# Patient Record
Sex: Female | Born: 1960 | Race: White | Hispanic: No | Marital: Married
Health system: Midwestern US, Academic
[De-identification: ages and names within clinical notes are randomized; demographics above are authoritative.]

## PROBLEM LIST (undated history)

## (undated) MED FILL — METHOCARBAMOL 500 MG TABLET: 500 500 MG | ORAL | 30 days supply | Qty: 90 | Fill #0

## (undated) MED FILL — PREDNISONE 10 MG TABLET: 10 10 MG | ORAL | 30 days supply | Qty: 30 | Fill #0

## (undated) MED FILL — IXEKIZUMAB 80 MG/ML SUBCUTANEOUS AUTO-INJECTOR: 80 80 mg/mL | SUBCUTANEOUS | 28 days supply | Qty: 3 | Fill #0

## (undated) MED FILL — SECUKINUMAB 150 MG/ML SUBCUTANEOUS PEN INJECTOR: 150 150 mg/mL | SUBCUTANEOUS | 28 days supply | Qty: 8 | Fill #0

---

## 2014-12-21 NOTE — Unmapped (Signed)
Patient called to get an appointment with Linda Matthews.  Per patient she is having discharge and redness from her left breast.  Dr. Desma Paganini was giving the patient hormone pellets which she believes could be the cause of her issue.  Please call to get the patient an appointment.

## 2017-06-16 NOTE — Telephone Encounter (Signed)
Abstract  

## 2017-08-28 ENCOUNTER — Ambulatory Visit: Admit: 2017-08-28 | Discharge: 2017-08-28 | Payer: PRIVATE HEALTH INSURANCE

## 2017-08-28 ENCOUNTER — Other Ambulatory Visit: Admit: 2017-08-28 | Payer: PRIVATE HEALTH INSURANCE

## 2017-08-28 ENCOUNTER — Inpatient Hospital Stay: Admit: 2017-08-28 | Discharge: 2017-09-01 | Payer: PRIVATE HEALTH INSURANCE

## 2017-08-28 DIAGNOSIS — M2550 Pain in unspecified joint: Secondary | ICD-10-CM

## 2017-08-28 DIAGNOSIS — M19042 Primary osteoarthritis, left hand: Secondary | ICD-10-CM

## 2017-08-28 LAB — VITAMIN D 25 HYDROXY: Vit D, 25-Hydroxy: 26.5 ng/mL (ref 30.0–100)

## 2017-08-28 LAB — HLA-B27 HISTOCOMPATIBILITY ANTIGEN: HLA B27: NEGATIVE

## 2017-08-28 LAB — ANA W/REFLEX TO IFA/COMP PANEL
ANA Ratio: 0.86 ratio (ref 0.00–0.99)
ANA Screen: NEGATIVE

## 2017-08-28 LAB — ANA (IFA) WITH TITER
ANA Titer by IFA: POSITIVE
Homogeneous Pattern: 1:320 {titer}
Nucleolar Pattern: 1:160 {titer}

## 2017-08-28 LAB — DOUBLE STRANDED DNA AB
ds DNA Ab: NEGATIVE
dsDNA NUM: 175 [IU]/mL (ref 0–200)

## 2017-08-28 LAB — CK: Total CK: 55 U/L (ref 30–223)

## 2017-08-28 LAB — C3 COMPLEMENT: C3 Complement: 135 mg/dL (ref 87–200)

## 2017-08-28 LAB — URIC ACID: Uric Acid: 5.8 mg/dL (ref 3.8–8.7)

## 2017-08-28 LAB — C4 COMPLEMENT: C4 Complement: 40 mg/dL (ref 19–52)

## 2017-08-28 NOTE — Progress Notes (Signed)
Subjective   HPI:   Patient ID: Linda Matthews is a 57 y.o. White or Caucasian female.    Chief Complaint:  Linda Matthews is a 57 y.o. White or Caucasian female here for a new patient visit.    She has been referred to rheumatology by Cristela Felt DO for concerns of joint pain.    She reports having seen by Dr. Sebastian Ache at [place], [place], and Dr. Manson Passey at [facility] in the past.    She reports her symptoms started in 2018 around June and have since progressed esp involving hands, thumb bases, right knee right ankle as well. She was diagnosed with psoriatic arthritis based on oligoarticular joint involvement and presence of sausage digit history. She has also tried physical therapy and steroid injection for the right trochanteric bursa.     Today she reports morning stiffness lasting about 20 minutes, and pain and stiffness associated with the bilateral ankles, hands, knees and lower back.     In terms of treatment, she has tried 12 weeks of enbrel which helped some, prednisone tapers which helped a lot, a month of remicade infusions causing increased heart rate, insufficient relief, fatigue, weakness and other issues. NSAIDs have helped her some as well and she has been on it for at least 11 months now. She reports her pain at a 4/10 and discomfort at a 7.10 in severity.    She reports she noticed initially her ankle on the right foot swelling up on her. She reports having minimal stiffness and the pain only came on when she walked on it. She then noticed pain and swelling for about a month. She noticed that it went away on its own. She took [place] which caused diarrhea and other issues. She took the enbrel which gave her a lot of fatigue symptoms and it did not work well. She reports she had switch to remicade and after two doses, she was noticed to have more shortness of breath and other symptoms.    She reports a new low back stiffness symptoms. This lasts about 45 minutes and is mostly in the  lower lumbar region.       No chief complaint on file.         Histories:     Past Medical History:   Diagnosis Date    Abnormal Pap smear of cervix     Acne     Allergy     Arthritis     Bilateral ovarian cysts     Breast disorder     Esophageal reflux     Fibroid, uterine     History of chicken pox     Hypertension     Menopausal symptoms     OSA on CPAP     Palpitations     Rh incompatibility      Social History     Social History    Marital status: Married     Spouse name: N/A    Number of children: N/A    Years of education: N/A     Social History Main Topics    Smoking status: Never Smoker    Smokeless tobacco: Never Used    Alcohol use Yes      Comment: SOCIALLY    Drug use: Unknown    Sexual activity: Not Asked     Other Topics Concern    None     Social History Narrative    None     Family History  Problem Relation Age of Onset    Alzheimer's disease Mother     Breast Cancer Mother     Dementia Mother     Depression Mother     Heart disease Mother     Hypertension Mother     Hyperlipidemia Mother     Snoring Mother     Arthritis Father     Cancer Father     Heart disease Father     Hypertension Father     Hyperlipidemia Father     Kidney disease Father     Snoring Father     Alcohol abuse Sister     Cancer Sister     Depression Sister     Hypertension Sister     Mental illness Sister     Sleep apnea Sister     Snoring Sister     Dementia Maternal Grandmother     Stroke Maternal Grandmother     Cancer Maternal Grandfather     Alzheimer's disease Maternal Aunt      OB History     No data available             ROS:   Review of Systems:   All other systems negative    Constitutional:      fatigue     weakness  Ears/Nose/Mouth/Throat:      tinnitus  Cardiovascular:      high blood pressure  Musculoskeletal:      morning stiffness     arthralgias     muscle weakness     joint swelling      I have reviewed the full 14 point review of systems and agree as stated,  with changes noted where appropriate.    Objective:   Physical Exam   Nursing note and vitals reviewed.  Constitutional: She is oriented to person, place, and time. She appears well-developed and well-nourished.   HENT:   Head: Normocephalic and atraumatic.   Eyes: Right eye exhibits no discharge. Left eye exhibits no discharge. No scleral icterus.   Neck: Normal range of motion. No thyromegaly present.   Cardiovascular: Normal rate and regular rhythm.  Exam reveals no gallop and no friction rub.    No murmur heard.  Pulmonary/Chest: Effort normal. No respiratory distress. She has no wheezes. She has no rales.   Abdominal: Soft. She exhibits no distension. There is no tenderness. There is no rebound.   Musculoskeletal: She exhibits tenderness. She exhibits no edema or deformity.   Neurological: She is oriented to person, place, and time.   Skin: Skin is warm. Rash noted. No erythema.   Psychiatric: She has a normal mood and affect. Her behavior is normal. Thought content normal.       Medications:     Current Outpatient Prescriptions:     benzoyl peroxide-erythromycin (BENZAMYCIN) gel, APPLY TO THE AFFECTED AREA(S) daily AS NEEDED, Disp: , Rfl:     celecoxib (CELEBREX) 200 MG capsule, Take by mouth., Disp: , Rfl:     citalopram (CELEXA) 20 MG tablet, Take 1 tablet by mouth daily., Disp: , Rfl:     losartan-hydrochlorothiazide (HYZAAR) 100-25 mg per tablet, , Disp: , Rfl:     amLODIPine (NORVASC) 10 MG tablet, Take by mouth., Disp: , Rfl:     cephALEXin (KEFLEX) 500 MG capsule, TAKE 1 CAPSULE BY MOUTH THREE TIMES DAILY for TEN days, Disp: , Rfl: 0    citalopram (CELEXA) 10 MG tablet, Take by mouth., Disp: , Rfl:  escitalopram oxalate (LEXAPRO) 20 MG tablet, Take by mouth., Disp: , Rfl:     etanercept (ENBREL) 50 mg/mL (0.98 mL) injection, Inject subcutaneously., Disp: , Rfl:     famotidine (PEPCID) 20 MG tablet, Take by mouth., Disp: , Rfl:     hydroxychloroquine (PLAQUENIL) 200 mg tablet, Take by  mouth., Disp: , Rfl:     ibuprofen (ADVIL) 100 MG tablet, Take by mouth., Disp: , Rfl:     ibuprofen (ADVIL,MOTRIN) 200 MG tablet, Take by mouth., Disp: , Rfl:     methylPREDNISolone (MEDROL DOSEPACK) 4 mg tablet, follow taper instructions in package, Disp: , Rfl: 2    venlafaxine (EFFEXOR-XR) 75 MG 24 hr capsule, Take by mouth., Disp: , Rfl:     Allergies:     Allergies   Allergen Reactions    Sulfur      Hives    Sulfa (Sulfonamide Antibiotics) Hives and Rash       Lab Review:   Lab Results   Component Value Date    DSDNA Negative 08/28/2017    C3 135 08/28/2017    C4 40 08/28/2017    HLAB27 Negative 08/28/2017     No visits with results within 2 Month(s) from this visit.   Latest known visit with results is:   No results found for Linda previous visit.         Prior Diagnostic Testing:  Imaging reviewed   No results found.    Rapid3 - Routine Assessment of Patient Index Data  My Chart Rapid3 Questionnaire Review 08/28/2017   Dress yourself, including tying shoelaces and doing buttons? 0   Get in and out of bed? 0   Lift a full cup or glass to your mouth? 0   Walk outdoors on flat ground? 0   Wash and dry your entire body? 0   Bend down to pick up clothing from the floor? 0   Turn regular faucets on and off? 0   Get in and out of a car, bus, train, or airplane? 0   Walk 2 miles or three kilometers, if you wish? 3   Paticipate in recreational activities and sports as you would like, if you wish? 3   Pain because of your condition over the past week 4.0   Considering how ways illness and health conditions affect you, how are you doing? 7.0   Rapid3 FN Total 2   Rapid3 Cumulative Score (0-30) 13       Conversion Table  Near Remission(NR):  1=0.3;  2=0.7;  3=1.0  Low Severity(LS):  4=1.3;  5=1.7;  6=2.0  Moderate Severity(MS):  7=2.3;  8=2.7;  9=3.0; 10=3.3;  11=3.7; 12=4.0  High Severity(HS):  13=4.3;  14=4.7;  15=5.0;  16=5.3;  17=5.7; 18=6.0;  19=6.3; 20=6.7; 21=7.0; 22=7.3; 23=7.7; 24=8.0; 25=8.3; 26=8.7;  27=9.0; 28=9.3; 29=9.7; 30=10.0    Patient's past medical history, social history, family history, allergies, and current medications were reviewed and updated as necessary.  Patient's lab work and imaging results were personally reviewed and discussed with the patient during today's visit.  Patient's concerns and questions about his diagnoses and medications were addressed with the patient and discussed in detail.       Assessment/Plan:   Linda Matthews is a 57 y.o. female.    Sicca symptoms  Previous ds DNA positive without SLE features  ? Sausage digit history ? Psoriatic arthritis    [place] notes to be obtained  Unclear findings of current inflammatory concerns needs more work up  EMG and MRI records    Orders Placed This Encounter   Procedures    X-ray Knee Left 3-views    X-ray Knee Right 3-views    X-ray Ankle Left min 3-views    X-ray Ankle Right min 3-views    X-ray Hand Left min 3-views    X-ray Hand Right min 3-views    X-ray SI joints minimum 3-views    ANA (IFA) with Titer    ANA w/reflex to IFA/Comp Panel    Double Stranded DNA Ab    C3 complement    C4 complement    Vitamin D 25 hydroxy    CK    HLA-B27 Histocompability Antigen    Uric acid     Will wait for the above results to discuss next steps with the patient.          The above assessment and plan, future steps and their importance, were discussed with the patient, who verbalized understanding of the same.  Patient's questions and/or concerns were addressed during the visit today.    Time Spent With Patient:  Time spent with patient:  60 minutes  Time spent discussing diagnosis, management, and treatment plan: > 35 minutes

## 2017-09-01 NOTE — Telephone Encounter (Signed)
Left message for pt to call

## 2017-09-01 NOTE — Unmapped (Signed)
-----   Message from Jiles Garter, MD sent at 09/01/2017 11:41 AM EDT -----  Degenerative osteoarthritis noted on imaging.  Marijean Bravo

## 2017-09-02 NOTE — Telephone Encounter (Signed)
Patient called back. She stated that she wanted to clarify if Dr. Cheri Rous was stating that there is not autoimmune disease present. She was wondering about the positive ANA . Please advise. OM

## 2017-09-02 NOTE — Telephone Encounter (Signed)
ANA positivity can be seen in multiple settings and may or may not reflect an autoimmune issue.   Still pending the HLA testing and will see how she does over the coming month and reassess for any new developments that might add to the current picture. Patients with current ANA positivity could in the future sometimes also develop a new autoimmune issues. Will discuss in greater detail at her coming visit later this month.    Linda Matthews

## 2017-09-02 NOTE — Telephone Encounter (Signed)
Pt has been notified of results    She looked at her labs, ANA is positive. She is asking for explanation of this result

## 2017-09-03 NOTE — Telephone Encounter (Signed)
Patient has been notified

## 2017-09-23 ENCOUNTER — Ambulatory Visit: Admit: 2017-09-23 | Discharge: 2017-09-23 | Payer: PRIVATE HEALTH INSURANCE

## 2017-09-23 ENCOUNTER — Other Ambulatory Visit: Admit: 2017-09-23 | Payer: PRIVATE HEALTH INSURANCE

## 2017-09-23 ENCOUNTER — Inpatient Hospital Stay: Admit: 2017-09-23 | Payer: PRIVATE HEALTH INSURANCE

## 2017-09-23 DIAGNOSIS — M064 Inflammatory polyarthropathy: Secondary | ICD-10-CM

## 2017-09-23 DIAGNOSIS — M1990 Unspecified osteoarthritis, unspecified site: Secondary | ICD-10-CM

## 2017-09-23 LAB — HEPATITIS B SURFACE ANTIGEN: Hep B Surface Ag: NONREACTIVE

## 2017-09-23 LAB — RHEUMATOID FACTOR: Rheumatoid Factor: <10 IU/mL (ref 0.0–14.0)

## 2017-09-23 LAB — C-REACTIVE PROTEIN: CRP: 10.4 mg/L (ref 1.0–10.0)

## 2017-09-23 LAB — SED RATE: Sed Rate: 10 mm/hr (ref 0–30)

## 2017-09-23 LAB — CYCLIC CITRUL POLYPEP AB IGG/IGA: Cyclic Citrullin Polypeptide Ab IgG/IgA: 5 units (ref 0–19)

## 2017-09-23 LAB — HEPATITIS B CORE IGM: Hep B Core IgM: NONREACTIVE

## 2017-09-23 LAB — HEPATITIS C ANTIBODY
HCV Ab: NONREACTIVE
HCVAB Number: 0.07 S/CO (ref 0.00–0.79)

## 2017-09-23 LAB — HEPATITIS A IGM: Hep A IgM: NONREACTIVE

## 2017-09-23 MED ORDER — predniSONE (DELTASONE) 10 MG tablet
10 | ORAL_TABLET | Freq: Every day | ORAL | 1 refills | Status: AC
Start: 2017-09-23 — End: 2017-10-09

## 2017-09-23 MED ORDER — ergocalciferol (ERGOCALCIFEROL) 50,000 unit capsule
1250 | ORAL_CAPSULE | ORAL | 2 refills | Status: AC
Start: 2017-09-23 — End: 2018-01-29

## 2017-09-23 NOTE — Unmapped (Signed)
Subjective   HPI:   Chief Complaint:  Linda Matthews is a 57 y.o. White or Caucasian female here for a Rheumatology follow up visit.    She is here for a follow up visit for sicca symptoms, psoriatic arthritis.    Last seen: 08/28/2017    Pain: (P) 6.0/10  Overall Discomfort: (P) 6.0/10  Flares: none  AM stiffness: 45 minutes   Medications used: as needed advil  Tolerating medications well without issues   Improved with: rest   Worse with:prolonged activity    She reports worsening Fatigue and Swelling of Ankles or Feet    Pending Dr. Aura Dials notes and EMG    Has tried enbrel and remicade in the past - she initially thought it helped but coming off of it has been causing issues    She has never started the plaquenil    Adverse effects of the remicade      Chief Complaint   Patient presents with   ??? Follow-up               Histories:     Past Medical History:   Diagnosis Date   ??? Abnormal Pap smear of cervix    ??? Acne    ??? Allergy    ??? Arthritis    ??? Bilateral ovarian cysts    ??? Breast disorder    ??? Esophageal reflux    ??? Fibroid, uterine    ??? History of chicken pox    ??? Hypertension    ??? Menopausal symptoms    ??? OSA on CPAP    ??? Palpitations    ??? Rh incompatibility       Social History     Social History   ??? Marital status: Married     Spouse name: N/A   ??? Number of children: N/A   ??? Years of education: N/A     Social History Main Topics   ??? Smoking status: Never Smoker   ??? Smokeless tobacco: Never Used   ??? Alcohol use Yes      Comment: SOCIALLY   ??? Drug use: Unknown   ??? Sexual activity: Not Asked     Other Topics Concern   ??? None     Social History Narrative   ??? None     Family History   Problem Relation Age of Onset   ??? Alzheimer's disease Mother    ??? Breast Cancer Mother    ??? Dementia Mother    ??? Depression Mother    ??? Heart disease Mother    ??? Hypertension Mother    ??? Hyperlipidemia Mother    ??? Snoring Mother    ??? Arthritis Father    ??? Cancer Father    ??? Heart disease Father    ??? Hypertension Father    ???  Hyperlipidemia Father    ??? Kidney disease Father    ??? Snoring Father    ??? Alcohol abuse Sister    ??? Cancer Sister    ??? Depression Sister    ??? Hypertension Sister    ??? Mental illness Sister    ??? Sleep apnea Sister    ??? Snoring Sister    ??? Dementia Maternal Grandmother    ??? Stroke Maternal Grandmother    ??? Cancer Maternal Grandfather    ??? Alzheimer's disease Maternal Aunt      OB History     No data available  ROS:   Review of Systems             I have reviewed the full 14 point review of systems and agree as stated, with changes noted where appropriate.    Objective:   Physical Exam   Nursing note and vitals reviewed.  Constitutional: She is oriented to person, place, and time. She appears well-developed and well-nourished.   HENT:   Head: Normocephalic and atraumatic.   Eyes: Right eye exhibits no discharge. Left eye exhibits no discharge. No scleral icterus.   Cardiovascular: Normal rate and regular rhythm.    Musculoskeletal: She exhibits tenderness. She exhibits no edema or deformity.   Neurological: She is alert and oriented to person, place, and time.   Skin: Skin is warm. Rash noted. No erythema.   Psychiatric: She has a normal mood and affect. Her behavior is normal.     Medications:     Current Outpatient Prescriptions:   ???  amLODIPine (NORVASC) 10 MG tablet, Take by mouth., Disp: , Rfl:   ???  benzoyl peroxide-erythromycin (BENZAMYCIN) gel, APPLY TO THE AFFECTED AREA(S) daily AS NEEDED, Disp: , Rfl:   ???  celecoxib (CELEBREX) 200 MG capsule, Take by mouth., Disp: , Rfl:   ???  cephALEXin (KEFLEX) 500 MG capsule, TAKE 1 CAPSULE BY MOUTH THREE TIMES DAILY for TEN days, Disp: , Rfl: 0  ???  citalopram (CELEXA) 10 MG tablet, Take by mouth., Disp: , Rfl:   ???  citalopram (CELEXA) 20 MG tablet, Take 1 tablet by mouth daily., Disp: , Rfl:   ???  escitalopram oxalate (LEXAPRO) 20 MG tablet, Take by mouth., Disp: , Rfl:   ???  etanercept (ENBREL) 50 mg/mL (0.98 mL) injection, Inject subcutaneously., Disp: , Rfl:   ???   famotidine (PEPCID) 20 MG tablet, Take by mouth., Disp: , Rfl:   ???  hydroxychloroquine (PLAQUENIL) 200 mg tablet, Take by mouth., Disp: , Rfl:   ???  ibuprofen (ADVIL) 100 MG tablet, Take by mouth., Disp: , Rfl:   ???  ibuprofen (ADVIL,MOTRIN) 200 MG tablet, Take by mouth., Disp: , Rfl:   ???  losartan-hydrochlorothiazide (HYZAAR) 100-25 mg per tablet, , Disp: , Rfl:   ???  methylPREDNISolone (MEDROL DOSEPACK) 4 mg tablet, follow taper instructions in package, Disp: , Rfl: 2  ???  venlafaxine (EFFEXOR-XR) 75 MG 24 hr capsule, Take by mouth., Disp: , Rfl:     Allergies:     Allergies   Allergen Reactions   ??? Sulfur      Hives   ??? Sulfa (Sulfonamide Antibiotics) Hives and Rash       Lab Review:   Lab Results   Component Value Date    DSDNA Negative 08/28/2017    C3 135 08/28/2017    C4 40 08/28/2017    HLAB27 Negative 08/28/2017     Specimen on 08/28/2017   Component Date Value   ??? ANA Titer by IFA 08/28/2017 Positive*   ??? Homogeneous Pattern 08/28/2017 1:320*   ??? Nucleolar Pattern 08/28/2017 1:160*   ??? Please Note 08/28/2017 Comment    ??? ANA Screen 08/28/2017 Negative    ??? ANA Ratio 08/28/2017 0.86    ??? ds DNA Ab 08/28/2017 Negative    ??? dsDNA NUM 08/28/2017 175    ??? C3 Complement 08/28/2017 135    ??? C4 Complement 08/28/2017 40    ??? Vit D, 25-Hydroxy 08/28/2017 26.5*   ??? Total CK 08/28/2017 55    ??? HLA B27 08/28/2017 Negative    ???  Uric Acid 08/28/2017 5.8            Prior Diagnostic Testing:  Imaging reviewed   X-ray Si Joints Minimum 3-views    Result Date: 08/28/2017  IMPRESSION: Sacroiliac joints: No erosions or significant arthropathic changes. 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 Right hand: Moderate to severe arthrosis of the first carpometacarpal joint. Nonspecific lucency in the lunate, which could represent represent a small reactive cyst or erosion. Left hand: Severe first carpometacarpal  joint arthrosis. Right ankle: No acute osseous abnormality or significant arthropathic changes. Left ankle: No acute osseous abnormality or significant arthropathic changes. Report Verified by: Particia Nearing, MD at 08/28/2017 4:08 PM EDT    X-ray Hand Left Min 3-views    Result Date: 08/28/2017  IMPRESSION: Sacroiliac joints: No erosions or significant arthropathic changes. 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 Right hand: Moderate to severe arthrosis of the first carpometacarpal joint. Nonspecific lucency in the lunate, which could represent represent a small reactive cyst or erosion. Left hand: Severe first carpometacarpal joint arthrosis. Right ankle: No acute osseous abnormality or significant arthropathic changes. Left ankle: No acute osseous abnormality or significant arthropathic changes. Report Verified by: Particia Nearing, MD at 08/28/2017 4:08 PM EDT    X-ray Hand Right Min 3-views    Result Date: 08/28/2017  IMPRESSION: Sacroiliac joints: No erosions or significant arthropathic changes. 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 Right hand: Moderate to severe arthrosis of the first carpometacarpal joint. Nonspecific lucency in the lunate, which could represent represent a small reactive cyst or erosion. Left hand: Severe first carpometacarpal joint arthrosis. Right ankle: No acute osseous abnormality or significant arthropathic changes. Left ankle: No acute osseous abnormality or significant arthropathic changes. Report Verified by: Particia Nearing, MD at 08/28/2017 4:08 PM EDT    X-ray Knee Left 3-views    Result Date: 08/28/2017  IMPRESSION: Sacroiliac joints: No erosions or significant arthropathic changes. Right knee: Mild anterior compartment predominant  tricompartmental osteoarthrosis. 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 Right hand: Moderate to severe arthrosis of the first carpometacarpal joint. Nonspecific lucency in the lunate, which could represent represent a small reactive cyst or erosion. Left hand: Severe first carpometacarpal joint arthrosis. Right ankle: No acute osseous abnormality or significant arthropathic changes. Left ankle: No acute osseous abnormality or significant arthropathic changes. Report Verified by: Particia Nearing, MD at 08/28/2017 4:08 PM EDT    X-ray Knee Right 3-views    Result Date: 08/28/2017  IMPRESSION: Sacroiliac joints: No erosions or significant arthropathic changes. 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 Right hand: Moderate to severe arthrosis of the first carpometacarpal joint. Nonspecific lucency in the lunate, which could represent represent a small reactive cyst or erosion. Left hand: Severe first carpometacarpal joint arthrosis. Right ankle: No acute osseous abnormality or significant arthropathic changes. Left ankle: No acute osseous abnormality or significant arthropathic changes. Report Verified by: Particia Nearing, MD at 08/28/2017 4:08 PM EDT    X-ray Ankle Left Min 3-views    Result Date: 08/28/2017  IMPRESSION: Sacroiliac joints: No erosions or significant arthropathic changes. 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 Right hand: Moderate to severe arthrosis of the first carpometacarpal joint. Nonspecific lucency in the lunate, which could represent represent  a small reactive cyst or erosion. Left hand: Severe first carpometacarpal joint arthrosis. Right ankle: No acute osseous abnormality or significant arthropathic changes.  Left ankle: No acute osseous abnormality or significant arthropathic changes. Report Verified by: Particia Nearing, MD at 08/28/2017 4:08 PM EDT    X-ray Ankle Right Min 3-views    Result Date: 08/28/2017  IMPRESSION: Sacroiliac joints: No erosions or significant arthropathic changes. 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 Right hand: Moderate to severe arthrosis of the first carpometacarpal joint. Nonspecific lucency in the lunate, which could represent represent a small reactive cyst or erosion. Left hand: Severe first carpometacarpal joint arthrosis. Right ankle: No acute osseous abnormality or significant arthropathic changes. Left ankle: No acute osseous abnormality or significant arthropathic changes. Report Verified by: Particia Nearing, MD at 08/28/2017 4:08 PM EDT      Rapid3 - Routine Assessment of Patient Index Data  My Chart Rapid3 Questionnaire Review 09/22/2017 08/28/2017   Dress yourself, including tying shoelaces and doing buttons?  Without Any Difficulty -   Get in and out of bed?  With Some Difficulty -   Lift a full cup or glass to your mouth? Without Any Difficulty -   Walk outdoors on flat ground? Without Any Difficulty -   Wash and dry your entire body? Without Any Difficulty -   Bend down to pick up clothing from the floor? Without Any Difficulty -   Get in and out of a car, bus, train, or airplane? Without Any Difficulty -   Walk 2 miles or three kilometers, if you wish? Unable To Do -   Paticipate in recreational activities and sports as you would like, if you wish? Unable To Do -   Get a good night???s sleep? With Some Difficulty -   Deal with feelings of anxiety or being nervous? Without Any Difficulty -   Deal with feelings of depression or feeling blue? Without Any Difficulty -   Pain because of your condition over the past week 6.0 -   Considering how ways illness and  health conditions affect you, how are you doing? 6.0 -   Rapid Score A-J FN (0-10) 7 (2.3) -   Rapid3 PN Score (1-10) 6 -   Rapid3 PTGE Score (0-10) 6 -   Rapid3 Cumulative Score (0-30) 14.33 (HS) -   Dress yourself, including tying shoelaces and doing buttons? 0 0   Get in and out of bed? 1 0   Lift a full cup or glass to your mouth? 0 0   Walk outdoors on flat ground? 0 0   Wash and dry your entire body? 0 0   Bend down to pick up clothing from the floor? 0 0   Turn regular faucets on and off? 0 0   Get in and out of a car, bus, train, or airplane? 0 0   Walk 2 miles or three kilometers, if you wish? 3 3   Paticipate in recreational activities and sports as you would like, if you wish? 3 3   Pain because of your condition over the past week 6.0 4.0   Considering how ways illness and health conditions affect you, how are you doing? 6.0 7.0   Rapid3 FN Total 2.3 2   Rapid3 Cumulative Score (0-30) 14.3 13     Conversion Table  Near Remission(NR):  1=0.3;  2=0.7;  3=1.0  Low Severity(LS):  4=1.3;  5=1.7;  6=2.0  Moderate  Severity(MS):  7=2.3;  8=2.7;  9=3.0; 10=3.3;  11=3.7; 12=4.0  High Severity(HS):  13=4.3;  14=4.7;  15=5.0;  16=5.3;  17=5.7; 18=6.0;  19=6.3; 20=6.7; 21=7.0; 22=7.3; 23=7.7; 24=8.0; 25=8.3; 26=8.7; 27=9.0; 28=9.3; 29=9.7; 30=10.0    How easily do you do the following:             Patient's past medical history, social history, family history, allergies, and current medications were reviewed and updated as necessary.  Patient's lab work and imaging results were personally reviewed and discussed with the patient during today's visit.  Patient's concerns and questions about his diagnoses and medications were addressed with the patient and discussed in detail.       Assessment/Plan:   Terralyn is a 57 y.o. female.    Encounter Diagnosis   Name Primary?   ??? Inflammatory arthritis Yes     Orders Placed This Encounter   Procedures   ??? X-ray Chest PA and Lateral   ??? Sed Rate   ??? C-reactive protein   ???  Rheumatoid factor, Serum   ??? Acute Hepatitis Panel   ??? Cyclic Citrul Polypep Ab IgG/IgA     Reviewed notes from [facility] and ordered repeat blood work including RF CCP and also a baseline chest x ray and inflammatory markers and acute hepatitis panel.    She has had issues with remicade unclear if an infusion reaction but would not re challenge. There is thought of ? Psoriatic arthritis peripheral inflammatory arthritis that needs management. Might consider alternative TNF inhibitor for use. Provided patient information on this and will follow up after review of Dr. Aura Dials notes and blood work.          The above assessment and plan, future steps and their importance, were discussed with the patient, who verbalized understanding of the same.  Patient's questions and/or concerns were addressed during the visit today.

## 2017-10-07 NOTE — Telephone Encounter (Signed)
Pt contacted the office with update for MD. Pt stated she attempted to take 20 mg prednisone daily, ineffective. Pt then increased prednisone to 20 mg am & 10 mg pm, ineffective. Pt then increased prednisone to 20 mg am, 10 mg with lunch, & 10 mg pm. Per pt this dose of prednisone has been effective with swelling, but she is still experiencing joint pain that moves around, pt is unable to taper prednisone down. MD made aware of update.

## 2017-10-07 NOTE — Telephone Encounter (Signed)
Current dose of prednisone is too high to be on for inflammtory issues and we discused about worsening skin psoriasis since that is the diagnosis Dr. Aura Dials gave her. Will assess joint this week at her appointment but would recommend not exceeding 20 mg a day and avoid any NSAIDs while on it.    GG

## 2017-10-07 NOTE — Telephone Encounter (Signed)
Patient has been notified to go over 20 mg of prednisone

## 2017-10-09 ENCOUNTER — Ambulatory Visit: Admit: 2017-10-09 | Discharge: 2017-10-09 | Payer: PRIVATE HEALTH INSURANCE

## 2017-10-09 DIAGNOSIS — M47819 Spondylosis without myelopathy or radiculopathy, site unspecified: Secondary | ICD-10-CM

## 2017-10-09 MED ORDER — triamcinolone acetonide (KENALOG-40) injection 40 mg
40 | Freq: Once | INTRAMUSCULAR | Status: AC
Start: 2017-10-09 — End: 2017-10-09
  Administered 2017-10-09: 16:00:00 40 mg via INTRA_ARTICULAR

## 2017-10-09 MED ORDER — predniSONE (DELTASONE) 10 MG tablet
10 | ORAL_TABLET | Freq: Every day | ORAL | 1 refills | Status: AC
Start: 2017-10-09 — End: 2017-10-09

## 2017-10-09 MED ORDER — bupivacaine (MARCAINE) 0.5 % (5 mg/mL) injection 10 mg
0.5 | Freq: Once | INTRAMUSCULAR | Status: AC
Start: 2017-10-09 — End: 2017-10-09
  Administered 2017-10-09: 16:00:00 10 mL via OPHTHALMIC

## 2017-10-09 MED ORDER — predniSONE (DELTASONE) 10 MG tablet
10 | ORAL_TABLET | Freq: Every day | ORAL | 1 refills | Status: AC
Start: 2017-10-09 — End: 2017-11-01

## 2017-10-09 MED ORDER — secukinumab 150 mg/mL PnIj
150 | SUBCUTANEOUS | 0 refills | Status: AC
Start: 2017-10-09 — End: 2017-10-24

## 2017-10-09 NOTE — Telephone Encounter (Signed)
[facility] Note    Prescription received for: [email or web address]    Insurance rejection message: PA required    Insurance prior authorization process started via: CMM    Using EPIC chart as reference, provided requested information to insurance including:  diagnosis code, labs and treatment history    Claim status: Pending TB Test    Debarah Crape, PharmD, BCPS  10/09/2017 1:02 PM

## 2017-10-09 NOTE — Unmapped (Signed)
Subjective   HPI:   Chief Complaint:  Linda Matthews is a 57 y.o. White or Caucasian female here for a Rheumatology follow up visit.    She is here for a follow up visit for psoriatic arthritis.    Last seen: 09/23/2017    Pain: (P) 3.0/10  Overall Discomfort: (P) 3.0/10  Flares: none  AM stiffness: 45 minutes    Medications used: on hold for celebrex, plaquenil, prednisone 10 mg a day  Tolerating medications well without issues  Improved with: rest  Worse with: prolonged activity          Chief Complaint   Patient presents with   ??? Follow-up               Histories:     Past Medical History:   Diagnosis Date   ??? Abnormal Pap smear of cervix    ??? Acne    ??? Allergy    ??? Arthritis    ??? Bilateral ovarian cysts    ??? Breast disorder    ??? Esophageal reflux    ??? Fibroid, uterine    ??? History of chicken pox    ??? Hypertension    ??? Menopausal symptoms    ??? OSA on CPAP    ??? Palpitations    ??? Rh incompatibility       Social History     Social History   ??? Marital status: Married     Spouse name: N/A   ??? Number of children: N/A   ??? Years of education: N/A     Social History Main Topics   ??? Smoking status: Never Smoker   ??? Smokeless tobacco: Never Used   ??? Alcohol use Yes      Comment: SOCIALLY   ??? Drug use: Unknown   ??? Sexual activity: Not Asked     Other Topics Concern   ??? None     Social History Narrative   ??? None     Family History   Problem Relation Age of Onset   ??? Alzheimer's disease Mother    ??? Breast Cancer Mother    ??? Dementia Mother    ??? Depression Mother    ??? Heart disease Mother    ??? Hypertension Mother    ??? Hyperlipidemia Mother    ??? Snoring Mother    ??? Arthritis Father    ??? Cancer Father    ??? Heart disease Father    ??? Hypertension Father    ??? Hyperlipidemia Father    ??? Kidney disease Father    ??? Snoring Father    ??? Alcohol abuse Sister    ??? Cancer Sister    ??? Depression Sister    ??? Hypertension Sister    ??? Mental illness Sister    ??? Sleep apnea Sister    ??? Snoring Sister    ??? Dementia Maternal Grandmother    ??? Stroke  Maternal Grandmother    ??? Cancer Maternal Grandfather    ??? Alzheimer's disease Maternal Aunt      OB History     No data available             ROS:   Review of Systems             I have reviewed the full 14 point review of systems and agree as stated, with changes noted where appropriate.    Objective:   Physical Exam   Nursing note and vitals reviewed.  Constitutional: She  is oriented to person, place, and time. She appears well-developed and well-nourished.   HENT:   Head: Normocephalic and atraumatic.   Eyes: Right eye exhibits no discharge. Left eye exhibits no discharge. No scleral icterus.   Neck: Normal range of motion. No thyromegaly present.   Cardiovascular: Normal rate and regular rhythm.    Pulmonary/Chest: Effort normal. No respiratory distress.   Musculoskeletal: She exhibits edema and tenderness. She exhibits no deformity.   Neurological: She is alert and oriented to person, place, and time.   Skin: Skin is warm. No erythema.   Psychiatric: She has a normal mood and affect. Her behavior is normal.       Medications:     Current Outpatient Prescriptions:   ???  amLODIPine (NORVASC) 10 MG tablet, Take by mouth., Disp: , Rfl:   ???  benzoyl peroxide-erythromycin (BENZAMYCIN) gel, APPLY TO THE AFFECTED AREA(S) daily AS NEEDED, Disp: , Rfl:   ???  celecoxib (CELEBREX) 200 MG capsule, Take by mouth., Disp: , Rfl:   ???  cephALEXin (KEFLEX) 500 MG capsule, TAKE 1 CAPSULE BY MOUTH THREE TIMES DAILY for TEN days, Disp: , Rfl: 0  ???  citalopram (CELEXA) 10 MG tablet, Take by mouth., Disp: , Rfl:   ???  citalopram (CELEXA) 20 MG tablet, Take 1 tablet by mouth daily., Disp: , Rfl:   ???  ergocalciferol (ERGOCALCIFEROL) 50,000 unit capsule, Take 1 capsule (50,000 Units total) by mouth once a week., Disp: 4 capsule, Rfl: 2  ???  escitalopram oxalate (LEXAPRO) 20 MG tablet, Take by mouth., Disp: , Rfl:   ???  etanercept (ENBREL) 50 mg/mL (0.98 mL) injection, Inject subcutaneously., Disp: , Rfl:   ???  famotidine (PEPCID) 20 MG  tablet, Take by mouth., Disp: , Rfl:   ???  hydroxychloroquine (PLAQUENIL) 200 mg tablet, Take by mouth., Disp: , Rfl:   ???  ibuprofen (ADVIL) 100 MG tablet, Take by mouth., Disp: , Rfl:   ???  ibuprofen (ADVIL,MOTRIN) 200 MG tablet, Take by mouth., Disp: , Rfl:   ???  losartan-hydrochlorothiazide (HYZAAR) 100-25 mg per tablet, , Disp: , Rfl:   ???  predniSONE (DELTASONE) 10 MG tablet, Take 2 tablets (20 mg total) by mouth daily., Disp: 60 tablet, Rfl: 1  ???  venlafaxine (EFFEXOR-XR) 75 MG 24 hr capsule, Take by mouth., Disp: , Rfl:     Allergies:     Allergies   Allergen Reactions   ??? Sulfur      Hives   ??? Sulfa (Sulfonamide Antibiotics) Hives and Rash       Lab Review:   Lab Results   Component Value Date    ESR 10 09/23/2017    CRP 10.4 (H) 09/23/2017    RF <10.0 09/23/2017    DSDNA Negative 08/28/2017    C3 135 08/28/2017    C4 40 08/28/2017    HLAB27 Negative 08/28/2017     Specimen on 09/23/2017   Component Date Value   ??? Sed Rate 09/23/2017 10    ??? CRP 09/23/2017 10.4*   ??? Rheumatoid Factor 09/23/2017 <10.0    ??? Cyclic Citrullin Polypep* 09/23/2017 5    ??? HCV Ab 09/23/2017 Nonreactive    ??? HCVAB Number 09/23/2017 0.07    ??? Hep A IgM 09/23/2017 Nonreactive    ??? Hep B Core IgM 09/23/2017 Nonreactive    ??? Hepatitis B Surface Ag 09/23/2017 Nonreactive    Specimen on 08/28/2017   Component Date Value   ??? ANA Titer by IFA 08/28/2017 Positive*   ???  Homogeneous Pattern 08/28/2017 1:320*   ??? Nucleolar Pattern 08/28/2017 1:160*   ??? Please Note 08/28/2017 Comment    ??? ANA Screen 08/28/2017 Negative    ??? ANA Ratio 08/28/2017 0.86    ??? ds DNA Ab 08/28/2017 Negative    ??? dsDNA NUM 08/28/2017 175    ??? C3 Complement 08/28/2017 135    ??? C4 Complement 08/28/2017 40    ??? Vit D, 25-Hydroxy 08/28/2017 26.5*   ??? Total CK 08/28/2017 55    ??? HLA B27 08/28/2017 Negative    ??? Uric Acid 08/28/2017 5.8            Prior Diagnostic Testing:  Imaging reviewed   X-ray Chest Pa And Lateral    Result Date: 09/23/2017  IMPRESSION: No acute cardiopulmonary  findings. Approved by Theresa Mulligan, DO on 09/23/2017 9:12 AM EDT I have personally reviewed the images and I agree with this report. Report Verified by: Essie Hart, MD at 09/23/2017 9:35 AM EDT    X-ray Si Joints Minimum 3-views    Result Date: 08/28/2017  IMPRESSION: Sacroiliac joints: No erosions or significant arthropathic changes. 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 Right hand: Moderate to severe arthrosis of the first carpometacarpal joint. Nonspecific lucency in the lunate, which could represent represent a small reactive cyst or erosion. Left hand: Severe first carpometacarpal joint arthrosis. Right ankle: No acute osseous abnormality or significant arthropathic changes. Left ankle: No acute osseous abnormality or significant arthropathic changes. Report Verified by: Particia Nearing, MD at 08/28/2017 4:08 PM EDT    X-ray Hand Left Min 3-views    Result Date: 08/28/2017  IMPRESSION: Sacroiliac joints: No erosions or significant arthropathic changes. 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 Right hand: Moderate to severe arthrosis of the first carpometacarpal joint. Nonspecific lucency in the lunate, which could represent represent a small reactive cyst or erosion. Left hand: Severe first carpometacarpal joint arthrosis. Right ankle: No acute osseous abnormality or significant arthropathic changes. Left ankle: No acute osseous abnormality or significant arthropathic changes. Report Verified by: Particia Nearing, MD at 08/28/2017 4:08 PM EDT    X-ray Hand Right Min 3-views    Result Date: 08/28/2017  IMPRESSION: Sacroiliac joints: No erosions or significant arthropathic changes. 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 Left  knee: Mild anterior compartment predominant tricompartmental osteoarthrosis. Right hand: Moderate to severe arthrosis of the first carpometacarpal joint. Nonspecific lucency in the lunate, which could represent represent a small reactive cyst or erosion. Left hand: Severe first carpometacarpal joint arthrosis. Right ankle: No acute osseous abnormality or significant arthropathic changes. Left ankle: No acute osseous abnormality or significant arthropathic changes. Report Verified by: Particia Nearing, MD at 08/28/2017 4:08 PM EDT    X-ray Knee Left 3-views    Result Date: 08/28/2017  IMPRESSION: Sacroiliac joints: No erosions or significant arthropathic changes. 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 Right hand: Moderate to severe arthrosis of the first carpometacarpal joint. Nonspecific lucency in the lunate, which could represent represent a small reactive cyst or erosion. Left hand: Severe first carpometacarpal joint arthrosis. Right ankle: No acute osseous abnormality or significant arthropathic changes. Left ankle: No acute osseous abnormality or significant arthropathic changes. Report Verified by: Particia Nearing, MD at 08/28/2017 4:08 PM EDT    X-ray Knee Right 3-views    Result Date: 08/28/2017  IMPRESSION:  Sacroiliac joints: No erosions or significant arthropathic changes. 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 Right hand: Moderate to severe arthrosis of the first carpometacarpal joint. Nonspecific lucency in the lunate, which could represent represent a small reactive cyst or erosion. Left hand: Severe first carpometacarpal joint arthrosis. Right ankle: No acute osseous abnormality or significant arthropathic changes. Left ankle: No acute osseous abnormality or  significant arthropathic changes. Report Verified by: Particia Nearing, MD at 08/28/2017 4:08 PM EDT    X-ray Ankle Left Min 3-views    Result Date: 08/28/2017  IMPRESSION: Sacroiliac joints: No erosions or significant arthropathic changes. 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 Right hand: Moderate to severe arthrosis of the first carpometacarpal joint. Nonspecific lucency in the lunate, which could represent represent a small reactive cyst or erosion. Left hand: Severe first carpometacarpal joint arthrosis. Right ankle: No acute osseous abnormality or significant arthropathic changes. Left ankle: No acute osseous abnormality or significant arthropathic changes. Report Verified by: Particia Nearing, MD at 08/28/2017 4:08 PM EDT    X-ray Ankle Right Min 3-views    Result Date: 08/28/2017  IMPRESSION: Sacroiliac joints: No erosions or significant arthropathic changes. 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 Right hand: Moderate to severe arthrosis of the first carpometacarpal joint. Nonspecific lucency in the lunate, which could represent represent a small reactive cyst or erosion. Left hand: Severe first carpometacarpal joint arthrosis. Right ankle: No acute osseous abnormality or significant arthropathic changes. Left ankle: No acute osseous abnormality or significant arthropathic changes. Report Verified by: Particia Nearing, MD at 08/28/2017 4:08 PM EDT      Rapid3 - Routine Assessment of Patient Index Data  My Chart Rapid3 Questionnaire Review 10/06/2017 09/22/2017 08/28/2017   Dress yourself, including tying shoelaces and doing buttons?  Without Any Difficulty Without Any Difficulty -   Get in and out of bed?  Without Any Difficulty With Some Difficulty -   Lift a full cup  or glass to your mouth? Without Any Difficulty Without Any Difficulty -   Walk outdoors on flat ground? Without Any Difficulty Without Any Difficulty -   Wash and dry your entire body? Without Any Difficulty Without Any Difficulty -   Bend down to pick up clothing from the floor? Without Any Difficulty Without Any Difficulty -   Get in and out of a car, bus, train, or airplane? Without Any Difficulty Without Any Difficulty -   Walk 2 miles or three kilometers, if you wish? With Much Difficulty Unable To Do -   Paticipate in recreational activities and sports as you would like, if you wish? With Some Difficulty Unable To Do -   Get a good night???s sleep? With Some Difficulty With Some Difficulty -   Deal with feelings of anxiety or being nervous? Without Any Difficulty Without Any Difficulty -   Deal with feelings of depression or feeling blue? Without Any Difficulty Without Any Difficulty -   Pain because of your condition over the past week 3.0 6.0 -   Considering how ways illness and health conditions affect you, how are you doing? 3.0 6.0 -   Rapid Score A-J FN (0-10) 3 (1.0) 7 (2.3) -   Rapid3 PN Score (1-10) 3 6 -   Rapid3 PTGE Score (0-10) 3 6 -   Rapid3 Cumulative Score (0-30) 7 (MS) 14.33 (HS) -  Dress yourself, including tying shoelaces and doing buttons? 0 0 0   Get in and out of bed? 0 1 0   Lift a full cup or glass to your mouth? 0 0 0   Walk outdoors on flat ground? 0 0 0   Wash and dry your entire body? 0 0 0   Bend down to pick up clothing from the floor? 0 0 0   Turn regular faucets on and off? 0 0 0   Get in and out of a car, bus, train, or airplane? 0 0 0   Walk 2 miles or three kilometers, if you wish? 2 3 3    Paticipate in recreational activities and sports as you would like, if you wish? 1 3 3    Pain because of your condition over the past week 3.0 6.0 4.0   Considering how ways illness and health conditions affect you, how are you doing? 3.0 6.0 7.0   Rapid3 FN Total 1 2.3 2   Rapid3 Cumulative  Score (0-30) 7 14.3 13     Conversion Table  Near Remission(NR):  1=0.3;  2=0.7;  3=1.0  Low Severity(LS):  4=1.3;  5=1.7;  6=2.0  Moderate Severity(MS):  7=2.3;  8=2.7;  9=3.0; 10=3.3;  11=3.7; 12=4.0  High Severity(HS):  13=4.3;  14=4.7;  15=5.0;  16=5.3;  17=5.7; 18=6.0;  19=6.3; 20=6.7; 21=7.0; 22=7.3; 23=7.7; 24=8.0; 25=8.3; 26=8.7; 27=9.0; 28=9.3; 29=9.7; 30=10.0    How easily do you do the following:             Patient's past medical history, social history, family history, allergies, and current medications were reviewed and updated as necessary.  Patient's lab work and imaging results were personally reviewed and discussed with the patient during today's visit.  Patient's concerns and questions about his diagnoses and medications were addressed with the patient and discussed in detail.       Assessment/Plan:   Linda Matthews is a 56 y.o. female.    1. 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 Psoriatic arthritis  2. Right shoulder rotator cuff syndrome      Otezla - nausea and vomiting  Enbrel - caused improvement but some tingling and numbness  Remicade - caused the tingling to come back and shortness of breath with ? 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    I discussed the benefits and risks associated with the procedure including but not limited to risk of infection, local trauma, pain and bleeding and she agreed to proceed with the injection.    I confirmed with the patient about no prior  adverse reactions, no active infections, or relevant allergies. The injection site was marked with a pen and then the area sterilized with alcohol. The right shoulder region was then injected with 3 ml of 0.5% marcaine and 40 mg kenalog. The patient tolerated the procedure well and no immediate side effects or complications were observed. The injected area was cleaned up and dressed with a band aid.  she was instructed to avoid strenuous activity for the next 24 hours and use ice, or Tylenol for pain as needed. The patient will call immediately with any signs  of infection or allergic reaction.                The above assessment and plan, future steps and their importance, were discussed with the patient, who verbalized understanding of the same.  Patient's questions and/or concerns were addressed during the visit today.

## 2017-10-22 NOTE — Telephone Encounter (Signed)
Tb Test abstracted

## 2017-10-24 MED ORDER — secukinumab 150 mg/mL PnIj
150 | SUBCUTANEOUS | 0 refills | Status: AC
Start: 2017-10-24 — End: 2017-12-05

## 2017-10-24 NOTE — Telephone Encounter (Signed)
MEDICATION REQUESTED:    cosentyx    No flowsheet data found.          LAST APPT:  Last office visit:  10/09/2017  Last procedure visit:  Visit date not found    QUALITY INDICATOR FLOWSHEET RESULTS:   No flowsheet data found.        ALLERGIES:   Allergies   Allergen Reactions    Sulfur      Hives    Sulfa (Sulfonamide Antibiotics) Hives and Rash

## 2017-10-28 NOTE — Telephone Encounter (Signed)
Reviewed outside records obtained from [facility]. L5-Si shallow disc protrusion closer to the right S1 nerve root and bilateral L5 roots on her MRI. Disc bulge with trace retrolisthesis noted as well (05/05/2017).     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

## 2017-10-28 NOTE — Telephone Encounter (Signed)
 Specialty Pharmacy Note    Prior authorization approved for Cosentyx by patients insurance plan ([employer]).    PA expires on 01/22/2018     PA Reference number: [id number]    Insurance requires prescription to be filled at [facility]. Prescription forwarded by office. Patient informed.    Debarah Crape, PharmD, BCPS  10/28/2017 9:09 AM

## 2017-10-29 NOTE — Telephone Encounter (Signed)
Patient called and stated that she starting Cosentyx today and she has been tapering down the prednisone. She is down to 5 mg now and she is really struggling and hurting and feels very tired. Should she increase her prednisone up?    Please advise

## 2017-10-29 NOTE — Telephone Encounter (Signed)
Better to stay on that dose for now or can go up to 10 mg  If needed and we can reassess.  Marijean Bravo

## 2017-10-31 NOTE — Telephone Encounter (Signed)
Pt has been notified,  She wanted to know if she could take 10 mg on bad days?  Is that ok to do?    She will need a refill on prednisone, waiting on your response before Rx is pended

## 2017-10-31 NOTE — Telephone Encounter (Signed)
Sure that is fine. Please send Rx and I will send.  Linda Matthews

## 2017-11-01 MED ORDER — predniSONE (DELTASONE) 10 MG tablet
10 | ORAL_TABLET | Freq: Every day | ORAL | 1 refills | Status: AC
Start: 2017-11-01 — End: 2018-01-29

## 2017-11-04 NOTE — Telephone Encounter (Signed)
Pt contacted the office requesting MDs advice. Pt stated approx 3 days ago she developed cough/sore throat that wakes her up at night, pt denies fever. Pt stated she completed 1st dose of cosentyx on 10/30/17. Pt questioning if she should take next dose of cosentyx, or take ABX PCP prescribed that she has left over from other resp issues when taking enbrel that she never took & delay cosentyx injection. Info & question forwarded to MD.

## 2017-11-05 NOTE — Telephone Encounter (Signed)
Would hold the cosyntex and finish antibiotic course first - if better then start back the secukinumab.  Marijean Bravo

## 2017-11-06 NOTE — Telephone Encounter (Signed)
Left msg for pt to call back

## 2017-11-06 NOTE — Telephone Encounter (Signed)
Pt made aware of MDs response, pt verbalized understanding & agreeable. Pt stated she went to urgent care yesterday, she was prescribed a z-pack & tessalon capsules.

## 2017-11-06 NOTE — Telephone Encounter (Signed)
Pt notified

## 2017-11-10 NOTE — Telephone Encounter (Signed)
She had been taking a Z Pac for an upper respiratory infection and she called to inform you that it has progressed into walking pneumonia.  She has been put on Levaquin for this.    She is asking if she should hold Cosentyx?

## 2017-11-10 NOTE — Telephone Encounter (Signed)
Pt has been notified to hold off on cosentyx for now

## 2017-11-10 NOTE — Telephone Encounter (Signed)
Yes please continue to hold.  Linda Matthews

## 2017-11-17 NOTE — Telephone Encounter (Signed)
Patient notified and she still has 4 of the loading dose left. She only took one of July 4th. Will the 4 be enough? She just wanted to make sure .

## 2017-11-17 NOTE — Telephone Encounter (Signed)
Should be fine - might want to start over the loading phase if pharmacy would be ok with that.  Cough might be residual and should improve over the coming month.  Marijean Bravo

## 2017-11-17 NOTE — Telephone Encounter (Signed)
Pt contacted the office to make MD aware she has completed Abx, she is still experiencing a cough, but was told she would. Pt questioning if she can resume cosentyx. Question forwarded to MD.

## 2017-11-18 NOTE — Telephone Encounter (Signed)
Thayer Ohm, do we have to pre cert again? She is only one short to restart the 5 week loading dose.

## 2017-11-18 NOTE — Telephone Encounter (Signed)
I would recommend using weekly for 5 doses and then continue every 4 weeks. She should have enough to do it this way then.  GG

## 2017-11-18 NOTE — Telephone Encounter (Signed)
Patient notified and verbalized understanding

## 2017-11-19 NOTE — Telephone Encounter (Signed)
Left message for patient to call

## 2017-11-19 NOTE — Unmapped (Signed)
agree with Thayer Ohm - she should be able to get her supply for this month early, and since she did not take anything this past month for the major part she should have at least an extra shot.

## 2017-11-19 NOTE — Telephone Encounter (Signed)
Patient notified and is receiving the additional medication to complete 5 week starter dose

## 2017-11-27 NOTE — Telephone Encounter (Signed)
We should see her in clinic to assess - can we please add her next week? Also need more details on where she feels the weakness is and whether it is feeling weak due to the pain or just weakness without pain?    Linda Matthews

## 2017-11-27 NOTE — Telephone Encounter (Signed)
Pt called see let you know she is having a lot of muscle weekness and she wants to know if it the diease or the medication?

## 2017-11-27 NOTE — Telephone Encounter (Signed)
Pt said weakness with no pain, she has pain but not in the places she is feeling weak    Pt transferred up front to schedule appt for next week

## 2017-12-05 MED ORDER — secukinumab 150 mg/mL PnIj
150 | SUBCUTANEOUS | 0 refills | Status: AC
Start: 2017-12-05 — End: 2017-12-09

## 2017-12-05 NOTE — Telephone Encounter (Signed)
MEDICATION REQUESTED:    cosentyx    No flowsheet data found.          LAST APPT:  Last office visit:  10/09/2017  Last procedure visit:  Visit date not found    QUALITY INDICATOR FLOWSHEET RESULTS:   No flowsheet data found.        ALLERGIES:   Allergies   Allergen Reactions    Sulfur      Hives    Sulfa (Sulfonamide Antibiotics) Hives and Rash

## 2017-12-09 MED ORDER — secukinumab 150 mg/mL PnIj
150 | SUBCUTANEOUS | 0 refills | Status: AC
Start: 2017-12-09 — End: 2018-01-29

## 2017-12-09 NOTE — Unmapped (Signed)
[facility] requesting clarification on 12/05/17 cosentyx script instructions Sig: Inject 300 mg subcutaneously every 28 days. 300 mg weekly under skin for 5 weeks then every 4 weeks. Per Rx there records indicate pt has received all 5 doses for loading therapy.Please adjust script & resend if appropriate.

## 2017-12-09 NOTE — Telephone Encounter (Signed)
I edited and sent to [facility]

## 2017-12-25 ENCOUNTER — Ambulatory Visit: Admit: 2017-12-25 | Discharge: 2017-12-25 | Payer: PRIVATE HEALTH INSURANCE

## 2017-12-25 ENCOUNTER — Other Ambulatory Visit: Admit: 2017-12-25 | Payer: PRIVATE HEALTH INSURANCE

## 2017-12-25 DIAGNOSIS — L4050 Arthropathic psoriasis, unspecified: Secondary | ICD-10-CM

## 2017-12-25 DIAGNOSIS — Z5181 Encounter for therapeutic drug level monitoring: Secondary | ICD-10-CM

## 2017-12-25 LAB — C-REACTIVE PROTEIN: CRP: 18 mg/L (ref 1.0–10.0)

## 2017-12-25 LAB — SED RATE: Sed Rate: 18 mm/hr (ref 0–30)

## 2017-12-25 NOTE — Unmapped (Signed)
Subjective   HPI:   Chief Complaint:  Linda Matthews is a 57 y.o. White or Caucasian female here for a Rheumatology follow up visit.    She is here for a follow up visit for psoriatic arthritis.    Last seen: 10/09/2017    Pain: 6.0/10  Overall Discomfort: 7.5/10  Flares: none  AM stiffness: 30 minutes    Medications used: secukinumab, prednisone only as needed  Tolerating medications well without issues  Improved with: rest  Worse with: prolonged activity    She was seen in June 2019 and was not doing well and started on cosyntex. She reports feeling a little better and reports no adverse effects. Her stiffness is a little better. She reports right ankle, hip and hand pain symptoms.          Chief Complaint   Patient presents with   ??? Fatigue               Histories:     Past Medical History:   Diagnosis Date   ??? Abnormal Pap smear of cervix    ??? Acne    ??? Allergy    ??? Arthritis    ??? Bilateral ovarian cysts    ??? Breast disorder    ??? Esophageal reflux    ??? Fibroid, uterine    ??? History of chicken pox    ??? Hypertension    ??? Menopausal symptoms    ??? OSA on CPAP    ??? Palpitations    ??? Rh incompatibility       Social History     Social History   ??? Marital status: Married     Spouse name: N/A   ??? Number of children: N/A   ??? Years of education: N/A     Social History Main Topics   ??? Smoking status: Never Smoker   ??? Smokeless tobacco: Never Used   ??? Alcohol use Yes      Comment: SOCIALLY   ??? Drug use: Unknown   ??? Sexual activity: Not Asked     Other Topics Concern   ??? None     Social History Narrative   ??? None     Family History   Problem Relation Age of Onset   ??? Alzheimer's disease Mother    ??? Breast Cancer Mother    ??? Dementia Mother    ??? Depression Mother    ??? Heart disease Mother    ??? Hypertension Mother    ??? Hyperlipidemia Mother    ??? Snoring Mother    ??? Arthritis Father    ??? Cancer Father    ??? Heart disease Father    ??? Hypertension Father    ??? Hyperlipidemia Father    ??? Kidney disease Father    ??? Snoring Father    ???  Alcohol abuse Sister    ??? Cancer Sister    ??? Depression Sister    ??? Hypertension Sister    ??? Mental illness Sister    ??? Sleep apnea Sister    ??? Snoring Sister    ??? Dementia Maternal Grandmother    ??? Stroke Maternal Grandmother    ??? Cancer Maternal Grandfather    ??? Alzheimer's disease Maternal Aunt      OB History     No data available             ROS:   Review of Systems  I have reviewed the full 14 point review of systems and agree as stated, with changes noted where appropriate.    Objective:   Physical Exam   Nursing note and vitals reviewed.  Constitutional: She is oriented to person, place, and time. She appears well-developed and well-nourished.   HENT:   Head: Normocephalic and atraumatic.   Eyes: Right eye exhibits no discharge. Left eye exhibits no discharge. No scleral icterus.   Neck: Normal range of motion. No thyromegaly present.   Cardiovascular: Normal rate and regular rhythm.    Pulmonary/Chest: Effort normal. No respiratory distress.   Musculoskeletal: She exhibits edema and tenderness. She exhibits no deformity.   Neurological: She is alert and oriented to person, place, and time.   Skin: Skin is warm. No erythema.   Psychiatric: She has a normal mood and affect. Her behavior is normal.     Medications:     Current Outpatient Prescriptions:   ???  amLODIPine (NORVASC) 10 MG tablet, Take by mouth., Disp: , Rfl:   ???  celecoxib (CELEBREX) 200 MG capsule, Take by mouth., Disp: , Rfl:   ???  ergocalciferol (ERGOCALCIFEROL) 50,000 unit capsule, Take 1 capsule (50,000 Units total) by mouth once a week., Disp: 4 capsule, Rfl: 2  ???  escitalopram oxalate (LEXAPRO) 20 MG tablet, Take by mouth., Disp: , Rfl:   ???  ibuprofen (ADVIL) 100 MG tablet, Take by mouth., Disp: , Rfl:   ???  ibuprofen (ADVIL,MOTRIN) 200 MG tablet, Take by mouth., Disp: , Rfl:   ???  losartan-hydrochlorothiazide (HYZAAR) 100-25 mg per tablet, , Disp: , Rfl:   ???  predniSONE (DELTASONE) 10 MG tablet, Take 1 tablet (10 mg total) by mouth  daily., Disp: 30 tablet, Rfl: 1  ???  secukinumab 150 mg/mL PnIj, Inject 300 mg subcutaneously every 28 days. 300 mg weekly under skin weekly 5 weeks; then every 4 weeks, Disp: 10 mL, Rfl: 0  ???  benzoyl peroxide-erythromycin (BENZAMYCIN) gel, APPLY TO THE AFFECTED AREA(S) daily AS NEEDED, Disp: , Rfl:   ???  cephALEXin (KEFLEX) 500 MG capsule, TAKE 1 CAPSULE BY MOUTH THREE TIMES DAILY for TEN days, Disp: , Rfl: 0  ???  citalopram (CELEXA) 10 MG tablet, Take by mouth., Disp: , Rfl:   ???  citalopram (CELEXA) 20 MG tablet, Take 1 tablet by mouth daily., Disp: , Rfl:   ???  etanercept (ENBREL) 50 mg/mL (0.98 mL) injection, Inject subcutaneously., Disp: , Rfl:   ???  famotidine (PEPCID) 20 MG tablet, Take by mouth., Disp: , Rfl:   ???  hydroxychloroquine (PLAQUENIL) 200 mg tablet, Take by mouth., Disp: , Rfl:   ???  venlafaxine (EFFEXOR-XR) 75 MG 24 hr capsule, Take by mouth., Disp: , Rfl:     Allergies:     Allergies   Allergen Reactions   ??? Sulfur      Hives   ??? Sulfa (Sulfonamide Antibiotics) Hives and Rash       Lab Review:   Lab Results   Component Value Date    ESR 10 09/23/2017    CRP 10.4 (H) 09/23/2017    RF <10.0 09/23/2017    DSDNA Negative 08/28/2017    C3 135 08/28/2017    C4 40 08/28/2017    HLAB27 Negative 08/28/2017     No visits with results within 2 Month(s) from this visit.   Latest known visit with results is:   Specimen on 09/23/2017   Component Date Value   ??? Sed Rate 09/23/2017 10    ???  CRP 09/23/2017 10.4*   ??? Rheumatoid Factor 09/23/2017 <10.0    ??? Cyclic Citrullin Polypep* 09/23/2017 5    ??? HCV Ab 09/23/2017 Nonreactive    ??? HCVAB Number 09/23/2017 0.07    ??? Hep A IgM 09/23/2017 Nonreactive    ??? Hep B Core IgM 09/23/2017 Nonreactive    ??? Hepatitis B Surface Ag 09/23/2017 Nonreactive            Prior Diagnostic Testing:  Imaging reviewed   No results found.    Rapid3 - Routine Assessment of Patient Index Data  My Chart Rapid3 Questionnaire Review 10/06/2017 09/22/2017 08/28/2017   Dress yourself, including tying  shoelaces and doing buttons?  Without Any Difficulty Without Any Difficulty -   Get in and out of bed?  Without Any Difficulty With Some Difficulty -   Lift a full cup or glass to your mouth? Without Any Difficulty Without Any Difficulty -   Walk outdoors on flat ground? Without Any Difficulty Without Any Difficulty -   Wash and dry your entire body? Without Any Difficulty Without Any Difficulty -   Bend down to pick up clothing from the floor? Without Any Difficulty Without Any Difficulty -   Get in and out of a car, bus, train, or airplane? Without Any Difficulty Without Any Difficulty -   Walk 2 miles or three kilometers, if you wish? With Much Difficulty Unable To Do -   Paticipate in recreational activities and sports as you would like, if you wish? With Some Difficulty Unable To Do -   Get a good night's sleep? With Some Difficulty With Some Difficulty -   Deal with feelings of anxiety or being nervous Without Any Difficulty Without Any Difficulty -   Deal with feelings of depression or feeling blue? Without Any Difficulty Without Any Difficulty -   Pain because of your condition over the past week 3.0 6.0 -   Considering how ways illness and health conditions affect you, how are you doing? 3.0 6.0 -   Rapid Score A-J FN (0-10) 3 (1.0) 7 (2.3) -   Rapid3 PN Score (1-10) 3 6 -   Rapid3 PTGE Score (0-10) 3 6 -   Rapid3 Cumulative Score (0-30) 7 (MS) 14.33 (HS) -   Dress yourself, including tying shoelaces and doing buttons? 0 0 0   Get in and out of bed? 0 1 0   Lift a full cup or glass to your mouth? 0 0 0   Walk outdoors on flat ground? 0 0 0   Wash and dry your entire body? 0 0 0   Bend down to pick up clothing from the floor? 0 0 0   Turn regular faucets on and off? 0 0 0   Get in and out of a car, bus, train, or airplane? 0 0 0   Walk 2 miles or three kilometers, if you wish? 2 3 3    Paticipate in recreational activities and sports as you would like, if you wish? 1 3 3    Pain because of your condition over  the past week 3.0 6.0 4.0   Considering how ways illness and health conditions affect you, how are you doing? 3.0 6.0 7.0   Rapid3 FN Total 1 2.3 2   Rapid3 Cumulative Score (0-30) 7 14.3 13     Conversion Table  Near Remission(NR):  1=0.3;  2=0.7;  3=1.0  Low Severity(LS):  4=1.3;  5=1.7;  6=2.0  Moderate Severity(MS):  7=2.3;  8=2.7;  9=3.0; 10=3.3;  11=3.7; 12=4.0  High Severity(HS):  13=4.3;  14=4.7;  15=5.0;  16=5.3;  17=5.7; 18=6.0;  19=6.3; 20=6.7; 21=7.0; 22=7.3; 23=7.7; 24=8.0; 25=8.3; 26=8.7; 27=9.0; 28=9.3; 29=9.7; 30=10.0    How easily do you do the following:             Patient's past medical history, social history, family history, allergies, and current medications were reviewed and updated as necessary.  Patient's lab work and imaging results were personally reviewed and discussed with the patient during today's visit.  Patient's concerns and questions about his diagnoses and medications were addressed with the patient and discussed in detail.       Assessment/Plan:   Zani is a 57 y.o. female.    1. 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 Rotator cuff syndrome, right  3. Trochanteric bursitis  4. Medication monitoring encounter    Otezla - nausea and vomiting  Enbrel - caused improvement but some tingling and numbness  Remicade - caused the tingling to come back and shortness of breath with ? Infusion site rections    Cosyntex 300 mg a month now delayed due her infection concern    Orders Placed This Encounter   Procedures   ??? Sed Rate   ??? C-reactive protein   ??? Aquatic Therapy             The above assessment and plan, future steps and their importance, were discussed with the patient, who verbalized understanding of the same.  Patient's questions and/or concerns were addressed during the visit today.

## 2018-01-13 NOTE — Telephone Encounter (Signed)
Pt contacted the office requesting MDs advice. Pt stated she was evaluated by PCP Dx upper respiratory infection, CXR negative for pneumonia. Pt stated PCP has prescribed doxycycline, pt is going to wait a few days to see if infection is viral before starting ABX. Pt questioning if she should proceed with the 1st maintenance dose of cosentyx. Information & question forwarded to MD.

## 2018-01-13 NOTE — Unmapped (Signed)
Would recommend waiting a week and starting if symptoms still ongoing.

## 2018-01-14 NOTE — Telephone Encounter (Signed)
Patient notified and verbalized understanding

## 2018-01-29 ENCOUNTER — Ambulatory Visit: Admit: 2018-01-29 | Discharge: 2018-01-29 | Payer: PRIVATE HEALTH INSURANCE

## 2018-01-29 ENCOUNTER — Other Ambulatory Visit: Admit: 2018-01-29 | Payer: PRIVATE HEALTH INSURANCE

## 2018-01-29 DIAGNOSIS — Z5181 Encounter for therapeutic drug level monitoring: Secondary | ICD-10-CM

## 2018-01-29 DIAGNOSIS — L4050 Arthropathic psoriasis, unspecified: Secondary | ICD-10-CM

## 2018-01-29 LAB — COMPREHENSIVE METABOLIC PANEL
ALT: 31 U/L (ref 7–52)
AST: 24 U/L (ref 13–39)
Albumin: 4.1 g/dL (ref 3.5–5.7)
Alkaline Phosphatase: 71 U/L (ref 36–125)
Anion Gap: 9 mmol/L (ref 3–16)
BUN: 23 mg/dL (ref 7–25)
CO2: 29 mmol/L (ref 21–33)
Calcium: 9.8 mg/dL (ref 8.6–10.3)
Chloride: 101 mmol/L (ref 98–110)
Creatinine: 0.89 mg/dL (ref 0.60–1.30)
Glucose: 107 mg/dL (ref 70–100)
Osmolality, Calculated: 292 mOsm/kg (ref 278–305)
Potassium: 3.8 mmol/L (ref 3.5–5.3)
Sodium: 139 mmol/L (ref 133–146)
Total Bilirubin: 0.5 mg/dL (ref 0.0–1.5)
Total Protein: 6.8 g/dL (ref 6.4–8.9)
eGFR AA CKD-EPI: 83 See note.
eGFR NONAA CKD-EPI: 72 See note.

## 2018-01-29 LAB — CBC
Hematocrit: 42.4 % (ref 35.0–45.0)
Hemoglobin: 14.2 g/dL (ref 11.7–15.5)
MCH: 31.8 pg (ref 27.0–33.0)
MCHC: 33.5 g/dL (ref 32.0–36.0)
MCV: 95.1 fL (ref 80.0–100.0)
MPV: 8.4 fL (ref 7.5–11.5)
Platelets: 279 10*3/uL (ref 140–400)
RBC: 4.46 10*6/uL (ref 3.80–5.10)
RDW: 13.5 % (ref 11.0–15.0)
WBC: 6.7 10*3/uL (ref 3.8–10.8)

## 2018-01-29 LAB — C-REACTIVE PROTEIN: CRP: 13 mg/L (ref 1.0–10.0)

## 2018-01-29 LAB — SED RATE: Sed Rate: 9 mm/hr (ref 0–30)

## 2018-01-29 MED ORDER — ixekizumab (TALTZ AUTOINJECTOR) 80 mg/mL AtIn
80 | SUBCUTANEOUS | 1 refills | Status: AC
Start: 2018-01-29 — End: ?

## 2018-01-29 MED ORDER — methocarbamolROBAXIN500MGtablet
500 | ORAL_TABLET | Freq: Three times a day (TID) | ORAL | 1 refills | Status: AC
Start: 2018-01-29 — End: 2018-05-11

## 2018-01-29 MED ORDER — methocarbamol (ROBAXIN) 500 MG tablet
500 | ORAL_TABLET | Freq: Three times a day (TID) | ORAL | 1 refills | Status: AC
Start: 2018-01-29 — End: 2018-01-29

## 2018-01-29 MED ORDER — ixekizumab (TALTZ AUTOINJECTOR, 2 PACK,) 80 mg/mL AtIn
80 | SUBCUTANEOUS | 0 refills | Status: AC
Start: 2018-01-29 — End: ?

## 2018-01-29 MED ORDER — ixekizumab 80 mg/mL AtIn
80 | SUBCUTANEOUS | 0 refills | Status: AC
Start: 2018-01-29 — End: 2018-05-01

## 2018-01-29 MED ORDER — ixekizumab80mgmLAtIn
80 | SUBCUTANEOUS | 0 refills | Status: AC
Start: 2018-01-29 — End: 2018-01-29

## 2018-01-29 NOTE — Telephone Encounter (Signed)
[facility] Note    Prior authorization approved for Taltz by patients insurance plan [facility]).    PA expires on 04/29/2018     PA Reference number: [id number]    Insurance requires prescription to be filled at [facility]. Prescription forwarded. Patient informed.    Debarah Crape, PharmD, BCPS  01/29/2018 3:05 PM

## 2018-01-29 NOTE — Telephone Encounter (Signed)
[facility] Note    Prescription received for: Taltz    Insurance rejection message: PA required    Insurance prior authorization process started via: CMM    Using EPIC chart as reference, provided requested information to insurance including:  diagnosis code, labs and treatment history    Claim status: submitted.  Response expected in 48-72 hours.    Debarah Crape, PharmD, BCPS  01/29/2018 12:39 PM

## 2018-01-29 NOTE — Unmapped (Signed)
Subjective   HPI:   Chief Complaint:  Linda Matthews is a 57 y.o. White or Caucasian female here for a Rheumatology follow up visit.    She is here for a follow up visit for psoriatic arthritis.    Last seen: 12/25/2017    Pain: 8.0/10  Overall Discomfort: 8.0/10  Flares: none  AM stiffness: 2 hours or so    Medications used: cosyntex, prednisone only as needd  Tolerating medications well except upper respiratory symptoms  Improved with: rest   Worse with:prolonged activity    She reports her ankles and hands have been bothering her a lot. She reports it as a sharp pain.       Chief Complaint   Patient presents with   ??? Joint Pain               Histories:     Past Medical History:   Diagnosis Date   ??? Abnormal Pap smear of cervix    ??? Acne    ??? Allergy    ??? Arthritis    ??? Bilateral ovarian cysts    ??? Breast disorder    ??? Esophageal reflux    ??? Fibroid, uterine    ??? History of chicken pox    ??? Hypertension    ??? Menopausal symptoms    ??? OSA on CPAP    ??? Palpitations    ??? Rh incompatibility       Social History     Social History   ??? Marital status: Married     Spouse name: N/A   ??? Number of children: N/A   ??? Years of education: N/A     Social History Main Topics   ??? Smoking status: Never Smoker   ??? Smokeless tobacco: Never Used   ??? Alcohol use Yes      Comment: SOCIALLY   ??? Drug use: Unknown   ??? Sexual activity: Not Asked     Other Topics Concern   ??? None     Social History Narrative   ??? None     Family History   Problem Relation Age of Onset   ??? Alzheimer's disease Mother    ??? Breast Cancer Mother    ??? Dementia Mother    ??? Depression Mother    ??? Heart disease Mother    ??? Hypertension Mother    ??? Hyperlipidemia Mother    ??? Snoring Mother    ??? Arthritis Father    ??? Cancer Father    ??? Heart disease Father    ??? Hypertension Father    ??? Hyperlipidemia Father    ??? Kidney disease Father    ??? Snoring Father    ??? Alcohol abuse Sister    ??? Cancer Sister    ??? Depression Sister    ??? Hypertension Sister    ??? Mental illness Sister     ??? Sleep apnea Sister    ??? Snoring Sister    ??? Dementia Maternal Grandmother    ??? Stroke Maternal Grandmother    ??? Cancer Maternal Grandfather    ??? Alzheimer's disease Maternal Aunt      OB History     No data available             ROS:   Review of Systems  The patient has been experiencing : eye dryness or irritation, nausea, trouble sleeping, fatigue, cough, numbness or tingling, swelling of ankles or feet  I have reviewed the full 14 point review of systems and agree as stated, with changes noted where appropriate.    Objective:   Physical Exam   Nursing note and vitals reviewed.  Constitutional: She is oriented to person, place, and time. She appears well-developed and well-nourished.   HENT:   Head: Normocephalic and atraumatic.   Eyes: Right eye exhibits no discharge. Left eye exhibits no discharge. No scleral icterus.   Neck: Normal range of motion. No thyromegaly present.   Cardiovascular: Normal rate and regular rhythm.    Pulmonary/Chest: Effort normal. No respiratory distress.   Musculoskeletal: She exhibits edema and tenderness. She exhibits no deformity.   Neurological: She is alert and oriented to person, place, and time.   Skin: Skin is warm. No erythema.   Psychiatric: She has a normal mood and affect. Her behavior is normal.       Medications:     Current Outpatient Prescriptions:   ???  celecoxib (CELEBREX) 200 MG capsule, Take by mouth., Disp: , Rfl:   ???  citalopram (CELEXA) 20 MG tablet, Take 1 tablet by mouth daily., Disp: , Rfl:   ???  escitalopram oxalate (LEXAPRO) 20 MG tablet, Take by mouth., Disp: , Rfl:   ???  ibuprofen (ADVIL,MOTRIN) 200 MG tablet, Take by mouth., Disp: , Rfl:   ???  losartan-hydrochlorothiazide (HYZAAR) 100-25 mg per tablet, , Disp: , Rfl:   ???  secukinumab 150 mg/mL PnIj, Inject 300 mg subcutaneously every 28 days. 300 mg weekly under skin weekly 5 weeks; then every 4 weeks, Disp: 10 mL, Rfl: 0  ???  amLODIPine (NORVASC) 10 MG tablet, Take by mouth., Disp: , Rfl:   ???   benzoyl peroxide-erythromycin (BENZAMYCIN) gel, APPLY TO THE AFFECTED AREA(S) daily AS NEEDED, Disp: , Rfl:   ???  cephALEXin (KEFLEX) 500 MG capsule, TAKE 1 CAPSULE BY MOUTH THREE TIMES DAILY for TEN days, Disp: , Rfl: 0  ???  citalopram (CELEXA) 10 MG tablet, Take by mouth., Disp: , Rfl:   ???  ergocalciferol (ERGOCALCIFEROL) 50,000 unit capsule, Take 1 capsule (50,000 Units total) by mouth once a week., Disp: 4 capsule, Rfl: 2  ???  famotidine (PEPCID) 20 MG tablet, Take by mouth., Disp: , Rfl:   ???  ibuprofen (ADVIL) 100 MG tablet, Take by mouth., Disp: , Rfl:   ???  predniSONE (DELTASONE) 10 MG tablet, Take 1 tablet (10 mg total) by mouth daily., Disp: 30 tablet, Rfl: 1  ???  venlafaxine (EFFEXOR-XR) 75 MG 24 hr capsule, Take by mouth., Disp: , Rfl:     Allergies:     Allergies   Allergen Reactions   ??? Sulfur      Hives   ??? Sulfa (Sulfonamide Antibiotics) Hives and Rash       Lab Review:   Lab Results   Component Value Date    ESR 18 12/25/2017    CRP 18.0 (H) 12/25/2017    RF <10.0 09/23/2017    DSDNA Negative 08/28/2017    C3 135 08/28/2017    C4 40 08/28/2017    HLAB27 Negative 08/28/2017     Specimen on 12/25/2017   Component Date Value   ??? Sed Rate 12/25/2017 18    ??? CRP 12/25/2017 18.0*           Prior Diagnostic Testing:  Imaging reviewed   No results found.    Rapid3 - Routine Assessment of Patient Index Data  My Chart Rapid3 Questionnaire Review 01/29/2018 12/25/2017 10/06/2017 09/22/2017 08/28/2017  Dress yourself, including tying shoelaces and doing buttons?  - - Without Any Difficulty Without Any Difficulty -   Get in and out of bed?  - - Without Any Difficulty With Some Difficulty -   Lift a full cup or glass to your mouth? - - Without Any Difficulty Without Any Difficulty -   Walk outdoors on flat ground? - - Without Any Difficulty Without Any Difficulty -   Wash and dry your entire body? - - Without Any Difficulty Without Any Difficulty -   Bend down to pick up clothing from the floor? - - Without Any Difficulty  Without Any Difficulty -   Get in and out of a car, bus, train, or airplane? - - Without Any Difficulty Without Any Difficulty -   Walk 2 miles or three kilometers, if you wish? - - With Much Difficulty Unable To Do -   Paticipate in recreational activities and sports as you would like, if you wish? - - With Some Difficulty Unable To Do -   Get a good night's sleep? - - With Some Difficulty With Some Difficulty -   Deal with feelings of anxiety or being nervous - - Without Any Difficulty Without Any Difficulty -   Deal with feelings of depression or feeling blue? - - Without Any Difficulty Without Any Difficulty -   Pain because of your condition over the past week - - 3.0 6.0 -   Considering how ways illness and health conditions affect you, how are you doing? - - 3.0 6.0 -   Rapid Score A-J FN (0-10) - - 3 (1.0) 7 (2.3) -   Rapid3 PN Score (1-10) - - 3 6 -   Rapid3 PTGE Score (0-10) - - 3 6 -   Rapid3 Cumulative Score (0-30) - - 7 (MS) 14.33 (HS) -   Dress yourself, including tying shoelaces and doing buttons? 1 0 0 0 0   Get in and out of bed? 2 0 0 1 0   Lift a full cup or glass to your mouth? 0 0 0 0 0   Walk outdoors on flat ground? 1 0 0 0 0   Wash and dry your entire body? 1 0 0 0 0   Bend down to pick up clothing from the floor? 0 1 0 0 0   Turn regular faucets on and off? 0 0 0 0 0   Get in and out of a car, bus, train, or airplane? 1 1 0 0 0   Walk 2 miles or three kilometers, if you wish? 3 3 2 3 3    Paticipate in recreational activities and sports as you would like, if you wish? 3 3 1 3 3    Pain because of your condition over the past week 8.0 6.0 3.0 6.0 4.0   Considering how ways illness and health conditions affect you, how are you doing? 8.0 7.5 3.0 6.0 7.0   Rapid3 FN Total 4 2.7 1 2.3 2   Rapid3 Cumulative Score (0-30) 20 16.2 7 14.3 13     Conversion Table  Near Remission(NR):  1=0.3;  2=0.7;  3=1.0  Low Severity(LS):  4=1.3;  5=1.7;  6=2.0  Moderate Severity(MS):  7=2.3;  8=2.7;  9=3.0; 10=3.3;   11=3.7; 12=4.0  High Severity(HS):  13=4.3;  14=4.7;  15=5.0;  16=5.3;  17=5.7; 18=6.0;  19=6.3; 20=6.7; 21=7.0; 22=7.3; 23=7.7; 24=8.0; 25=8.3; 26=8.7; 27=9.0; 28=9.3; 29=9.7; 30=10.0    How easily do you do the following:  Patient's past medical history, social history, family history, allergies, and current medications were reviewed and updated as necessary.  Patient's lab work and imaging results were personally reviewed and discussed with the patient during today's visit.  Patient's concerns and questions about his diagnoses and medications were addressed with the patient and discussed in detail.       Assessment/Plan:   Linda Matthews is a 57 y.o. female.    1. 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 Rotator cuff syndrome, right  3. Trochanteric bursitis  4. Medication monitoring encounter  ??  Otezla - nausea and vomiting  Enbrel - caused improvement but some tingling and numbness  Remicade - caused the tingling to come back and shortness of breath with ? Infusion site rections  Cosentyx - not working despite 2 months use recurrent upper respiratory concerns  Not on methotrexate - history of elevated liver enzymes  Some tingling and numbness right now as well    Trial of ixekizumab for relief - will order and transition from secukinumab  Discussed risks and benefits and provided detailed hand out to the patient as well        The above assessment and plan, future steps and their importance, were discussed with the patient, who verbalized understanding of the same.  Patient's questions and/or concerns were addressed during the visit today.

## 2018-02-02 NOTE — Unmapped (Signed)
Spoke with pt 02/02/18 and verified pt was informed of  MRI approval. Pt scheduled MRI for 02/05/18. Pt advised to call clinic with questions or concerns.

## 2018-02-02 NOTE — Unmapped (Addendum)
PA approved on 01/29/18 by  [employer] for MRI HIP, KNEE,OR ANKLE, W/O CONTRAST for the following dates 01/29/18 to 03/15/18. PA# [id number]. Original submitted for scan. Copy filed in pt file. Pt notified and given number to schedule MRI [phone].

## 2018-02-05 ENCOUNTER — Inpatient Hospital Stay: Admit: 2018-02-05 | Discharge: 2018-02-10 | Payer: PRIVATE HEALTH INSURANCE

## 2018-02-05 DIAGNOSIS — M65871 Other synovitis and tenosynovitis, right ankle and foot: Secondary | ICD-10-CM

## 2018-02-05 MED ORDER — gadobutrol (GADAVIST) 1 mmol/1 mL IV solution 10 mL
1 | Freq: Once | INTRAVENOUS | Status: AC | PRN
Start: 2018-02-05 — End: 2018-02-05
  Administered 2018-02-05: 10 mL/kg via INTRAVENOUS

## 2018-02-06 NOTE — Telephone Encounter (Signed)
Patient called and stated that she had her MRI 10/10 and she wanted to know if she should continue medication? Supposed to start taltz?

## 2018-02-09 NOTE — Telephone Encounter (Signed)
Would go ahead with taltz - the MRI does confirm the inflammation.  Linda Matthews

## 2018-02-09 NOTE — Telephone Encounter (Signed)
Pt has been notified of results.

## 2018-02-12 NOTE — Telephone Encounter (Signed)
Sent via [employer]

## 2018-02-12 NOTE — Unmapped (Signed)
-----   Message from Jiles Garter, MD sent at 02/10/2018  1:01 PM EDT -----  MRI shows persistent inflammation - lets proceed with the alternative therapy we discussed.  GG

## 2018-03-23 NOTE — Telephone Encounter (Signed)
Pt contacted the office requesting MDs advice, pt stated she is considering coming off meds. Per pt she is still on Taltz loading dose  (4th or 5th), medication ineffective. Pt stated she is more swollen, 3+ edema BLE, PCP prescribed lasix & she is wearing compression stockings, my face & eyes are puffy. Pt also stated she is a little confused with her thought processes. Pt stated she is due for injection today/tomorrow & is requesting to know if she should even administer med. Information & question forwarded to MD.

## 2018-03-24 NOTE — Telephone Encounter (Signed)
Pt notified, pt verbalized understanding

## 2018-03-24 NOTE — Telephone Encounter (Signed)
She can stop and see how things change. We can always reassess her symptoms in a little bit.  Should also be seeing her PCP for these issues.  GG

## 2018-03-30 NOTE — Telephone Encounter (Signed)
Can reschedule if she would like if is not well, till her swelling issue settles down.  GG

## 2018-03-30 NOTE — Telephone Encounter (Signed)
She stopped Taltz about 3 weeks ago due to swelling issues.  As of now, she is still dealing but she is taking Lasix 40 mg daily.  She is scheduled to see you tomorrow and she is asking if she should keep this appt or reschedule?    Please advise

## 2018-03-31 NOTE — Telephone Encounter (Signed)
Left message for patient to call.

## 2018-03-31 NOTE — Telephone Encounter (Signed)
Pt.notified

## 2018-05-11 MED ORDER — methocarbamol (ROBAXIN) 500 MG tablet
500 | ORAL_TABLET | ORAL | 1 refills | Status: AC
Start: 2018-05-11 — End: ?

## 2018-05-11 NOTE — Unmapped (Signed)
MEDICATION REQUESTED:   METHOCARBAMOL 500 MG TABLET       MOST CURRENT LAB( MEDICAL ASSISTANT SHOULD CHECK MEDICAL RECORDS OFFICE, [facility] AND CARE EVERYWHERE FOR LABS AND THEN NOTE WHERE THE LABS ARE LOCATED AND ANY ABNORMALITIES HERE IN THIS MESSAGE):         LAST APPT:    Last office visit:  01/29/2018  Last procedure visit:  Visit date not found    QUALITY INDICATOR FLOWSHEET RESULTS:   No flowsheet data found.        ALLERGIES:   Allergies   Allergen Reactions   ??? Sulfur      Hives   ??? Sulfa (Sulfonamide Antibiotics) Hives and Rash     (MEDICAL ASSISTANT SHOULD CHECK [facility] AND DOCUMENT IN ALLERGY SECTION AND MARK AS REVIEWED)

## 2018-06-05 NOTE — Unmapped (Signed)
She is currently off all of her medications and she has bee experiencing lower leg swelling.  Her PCP has given her lasix's for the fluid retention.  Her right leg is always swollen.  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

## 2018-06-08 NOTE — Unmapped (Addendum)
Mailbox is full, try again

## 2018-06-08 NOTE — Unmapped (Signed)
Swelling itself needs work up by her PCP - without joint involvement it is unlikely due to the inflammatory arthritis.  It is difficult to say for sure without exam but needs to discuss with her PCP to make sure this improves.  Linda Matthews

## 2018-06-09 NOTE — Unmapped (Signed)
Pt has been notified to discuss with PCP

## 2019-08-10 IMAGING — DX HAND 3 VIEWS LEFT
1 series · 3 of 3 positions shown · non-contrast
Comparison: none

HAND 3 VIEWS LEFT, HAND 3 VIEWS RIGHT, 08/10/2019 [DATE]: 
CLINICAL INDICATION:  Pain. History of psoriatic arthritis. 
COMPARISON EXAMINATIONS: None prior of the hands.

[Series 1: PA · U · 0.12mm/px · 3 of 3 slices shown]
[im 1/3]
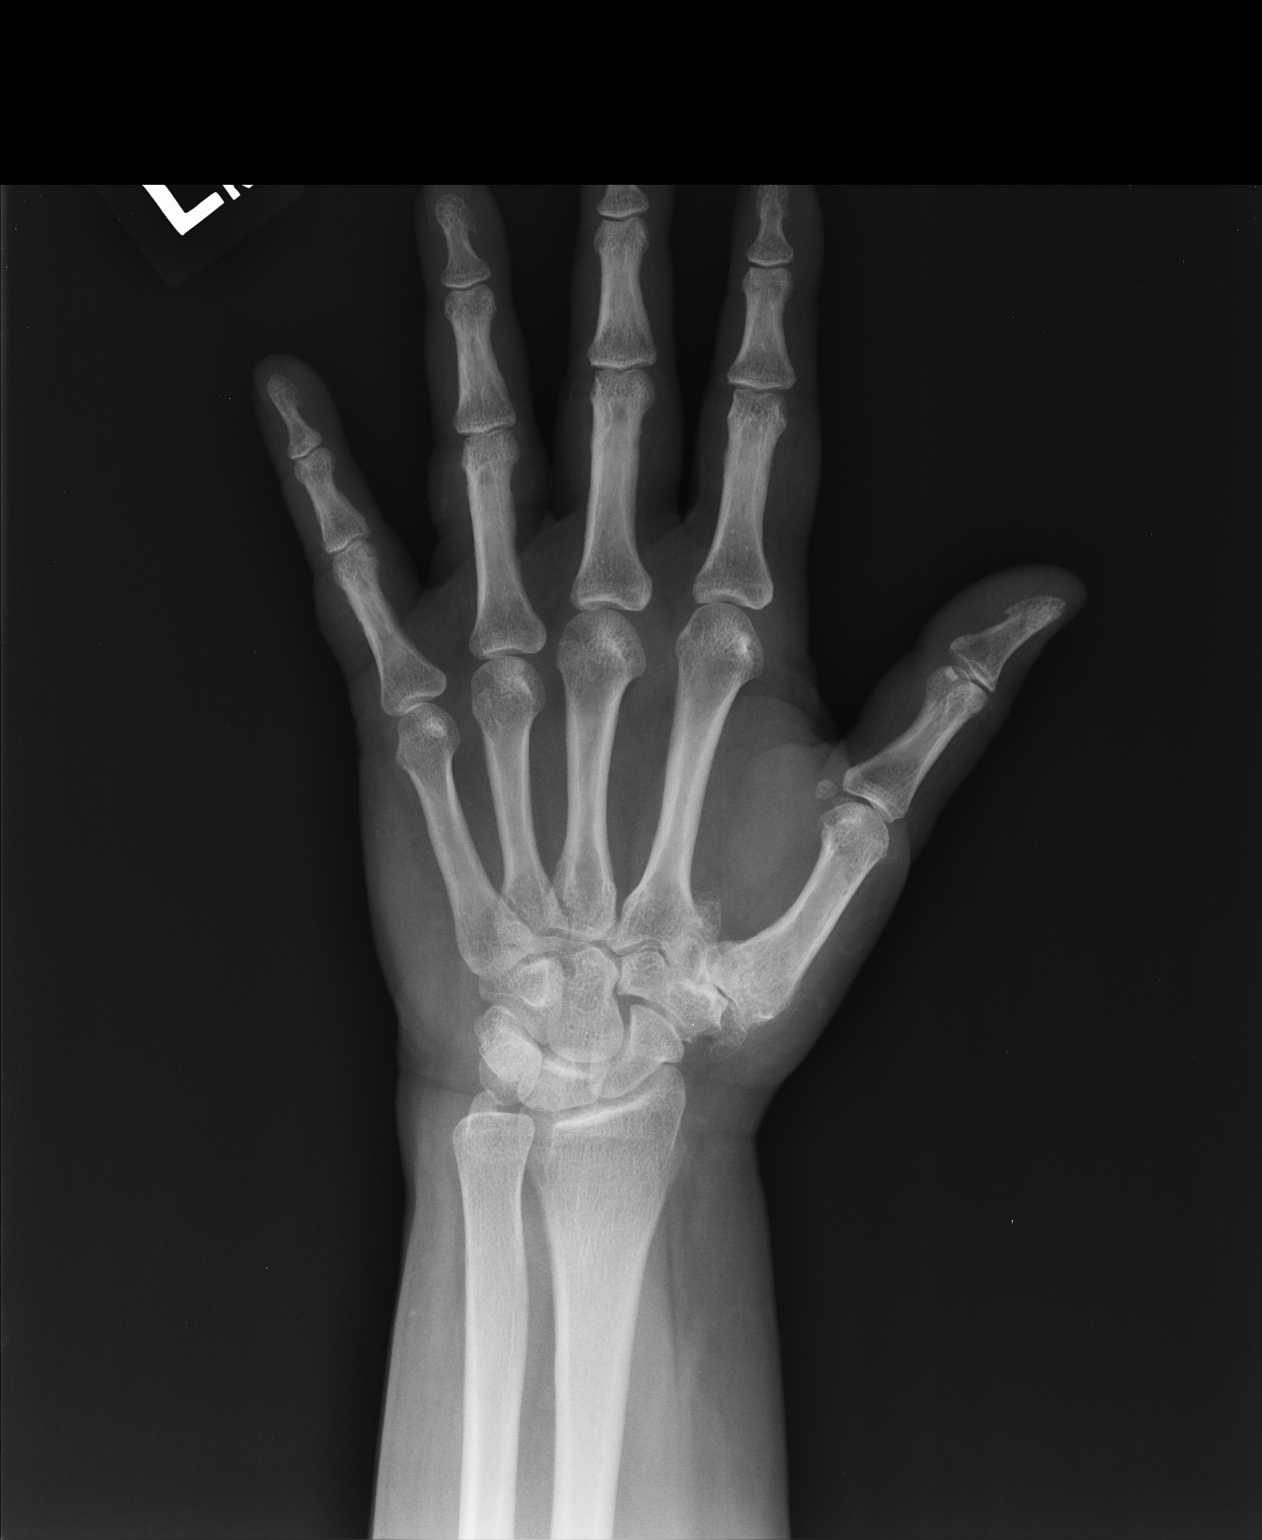
[im 2/3]
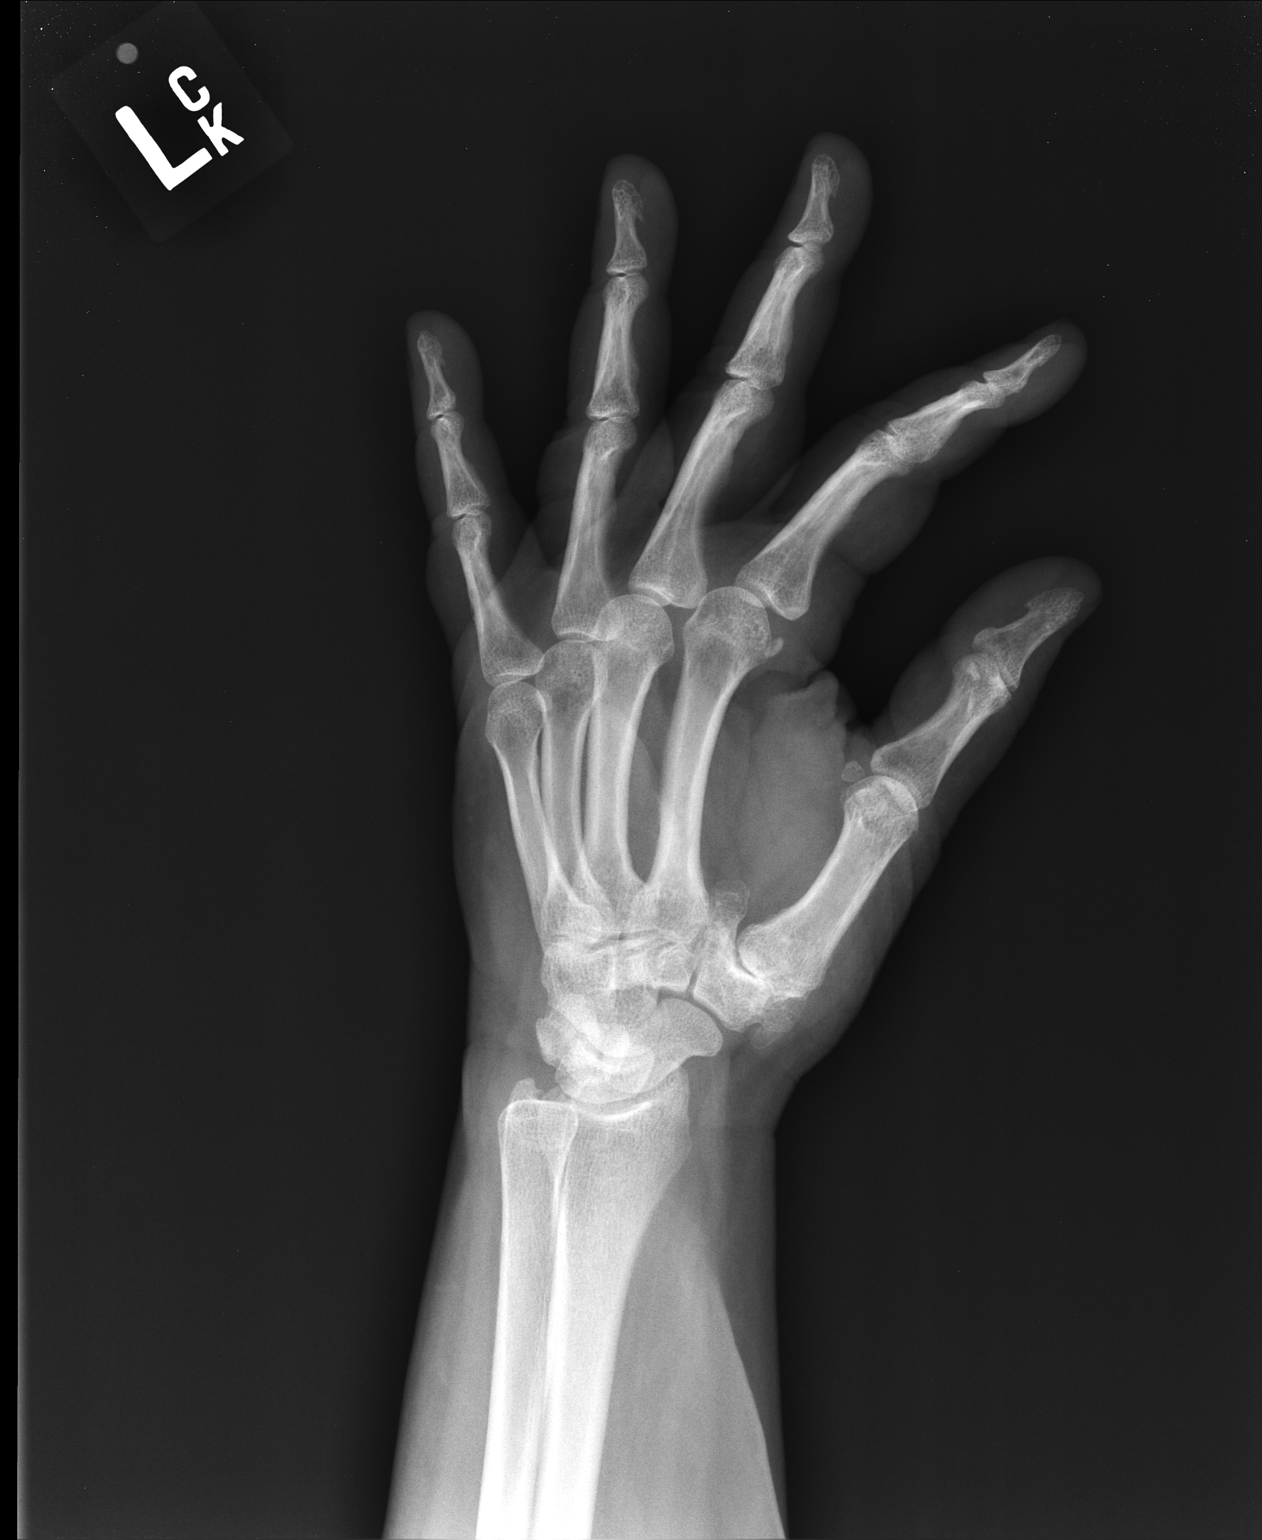
[im 3/3]
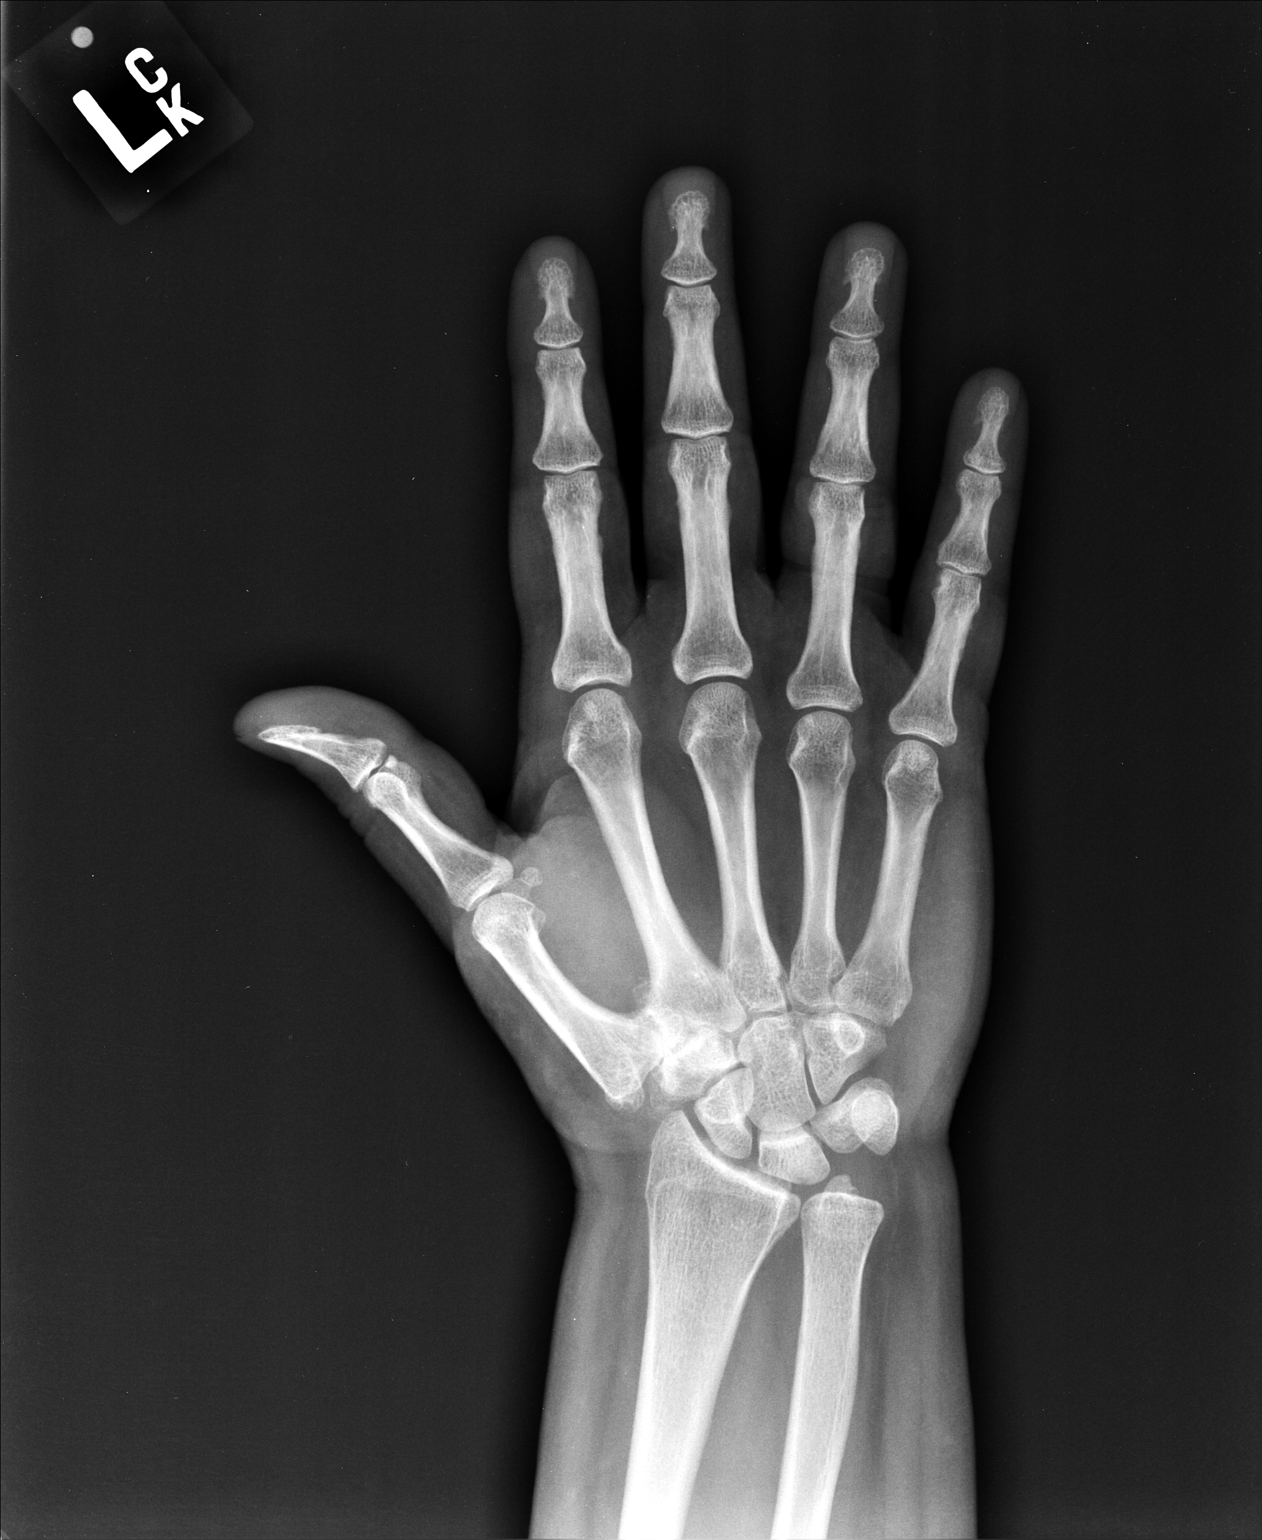

[3 of 3 positions shown; findings below may reference images not displayed]

FINDINGS: Focally advanced joint space narrowing of the bilateral first CMC 
articulations with osseous remodeling, osteophytic spurring, subcortical 
sclerosis and osseous debris. No fracture. No soft tissue swelling. Normal bone 
mineralization.
IMPRESSION: Focally advanced degenerative changes with osteoarthritic appearance involving 
the base of the thumbs.

## 2019-08-10 IMAGING — DX SHOULDER LEFT 2 VIEWS
1 series · 2 of 2 positions shown · non-contrast
Comparison: none

SHOULDER LEFT 2 VIEWS, 08/10/2019 [DATE]: 
CLINICAL INDICATION:  Pain. History of psoriatic arthritis. 
COMPARISON EXAMINATIONS: None prior of the shoulders.

[Series 1: ap ext rot · U · 0.12mm/px · 2 of 2 slices shown]
[im 1/2]
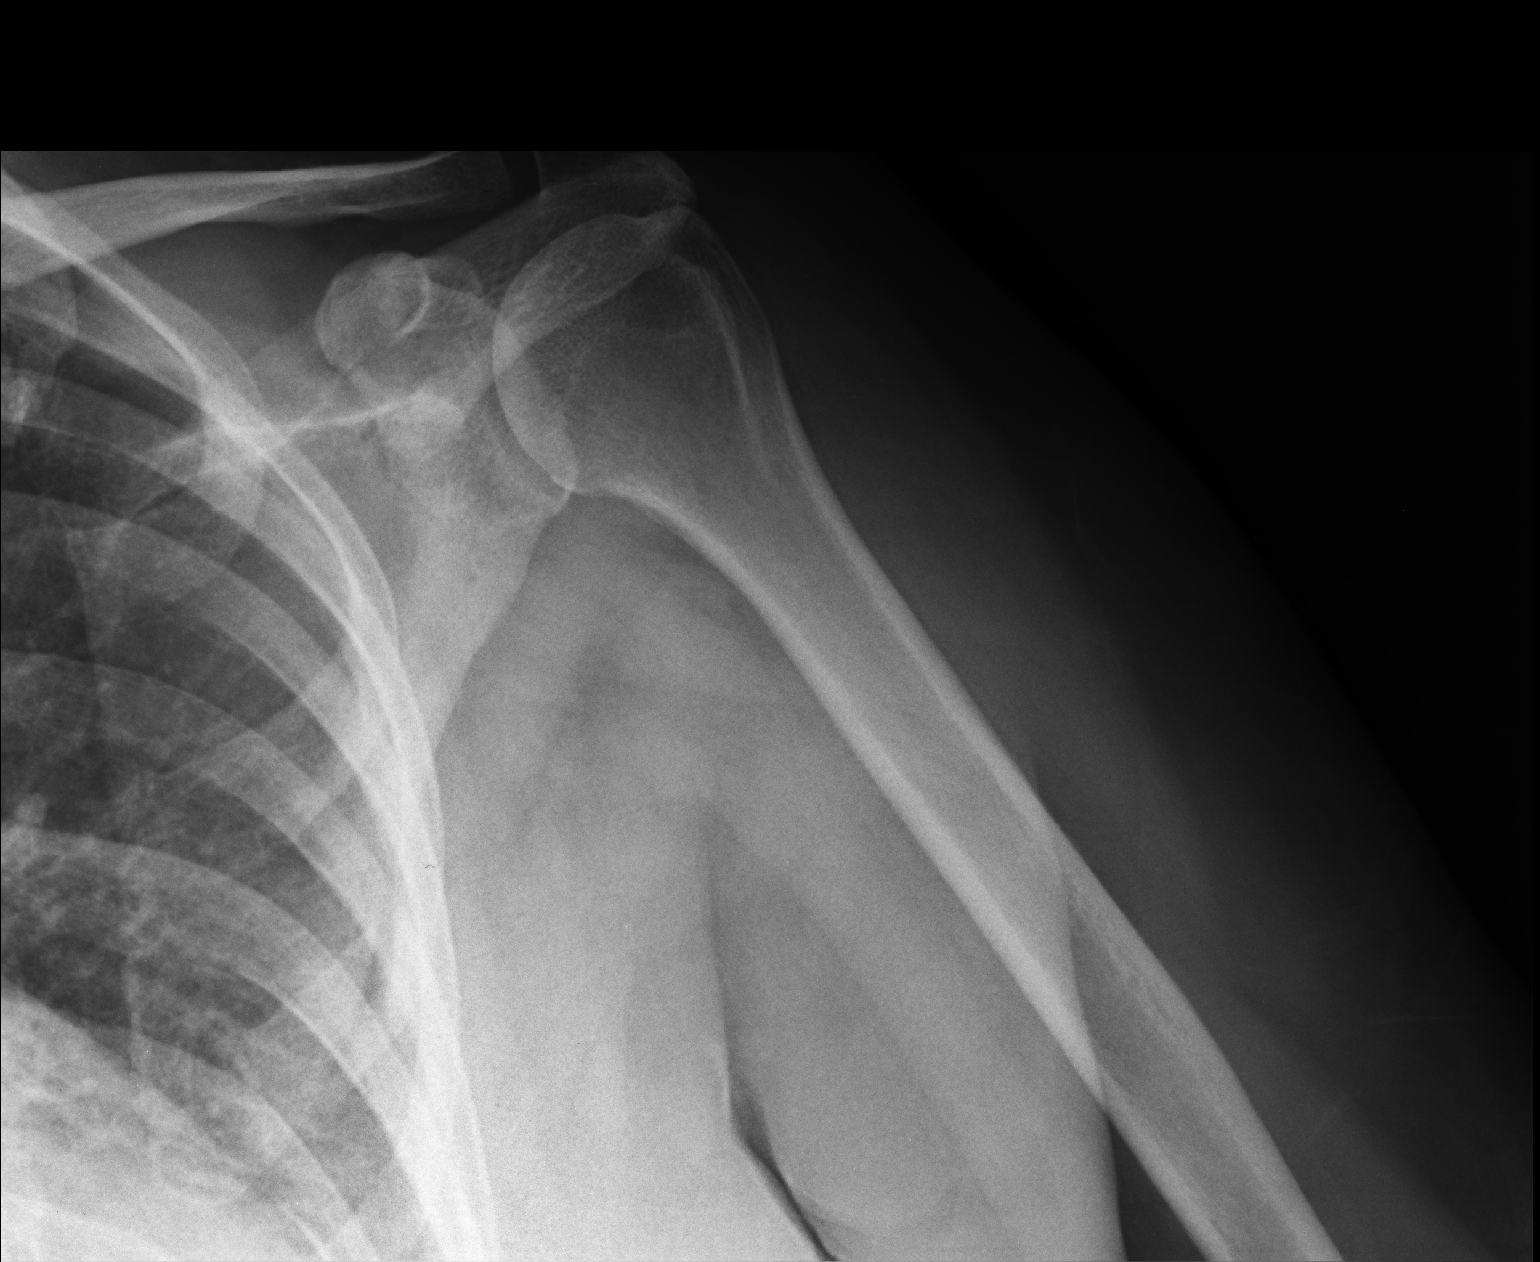
[im 2/2]
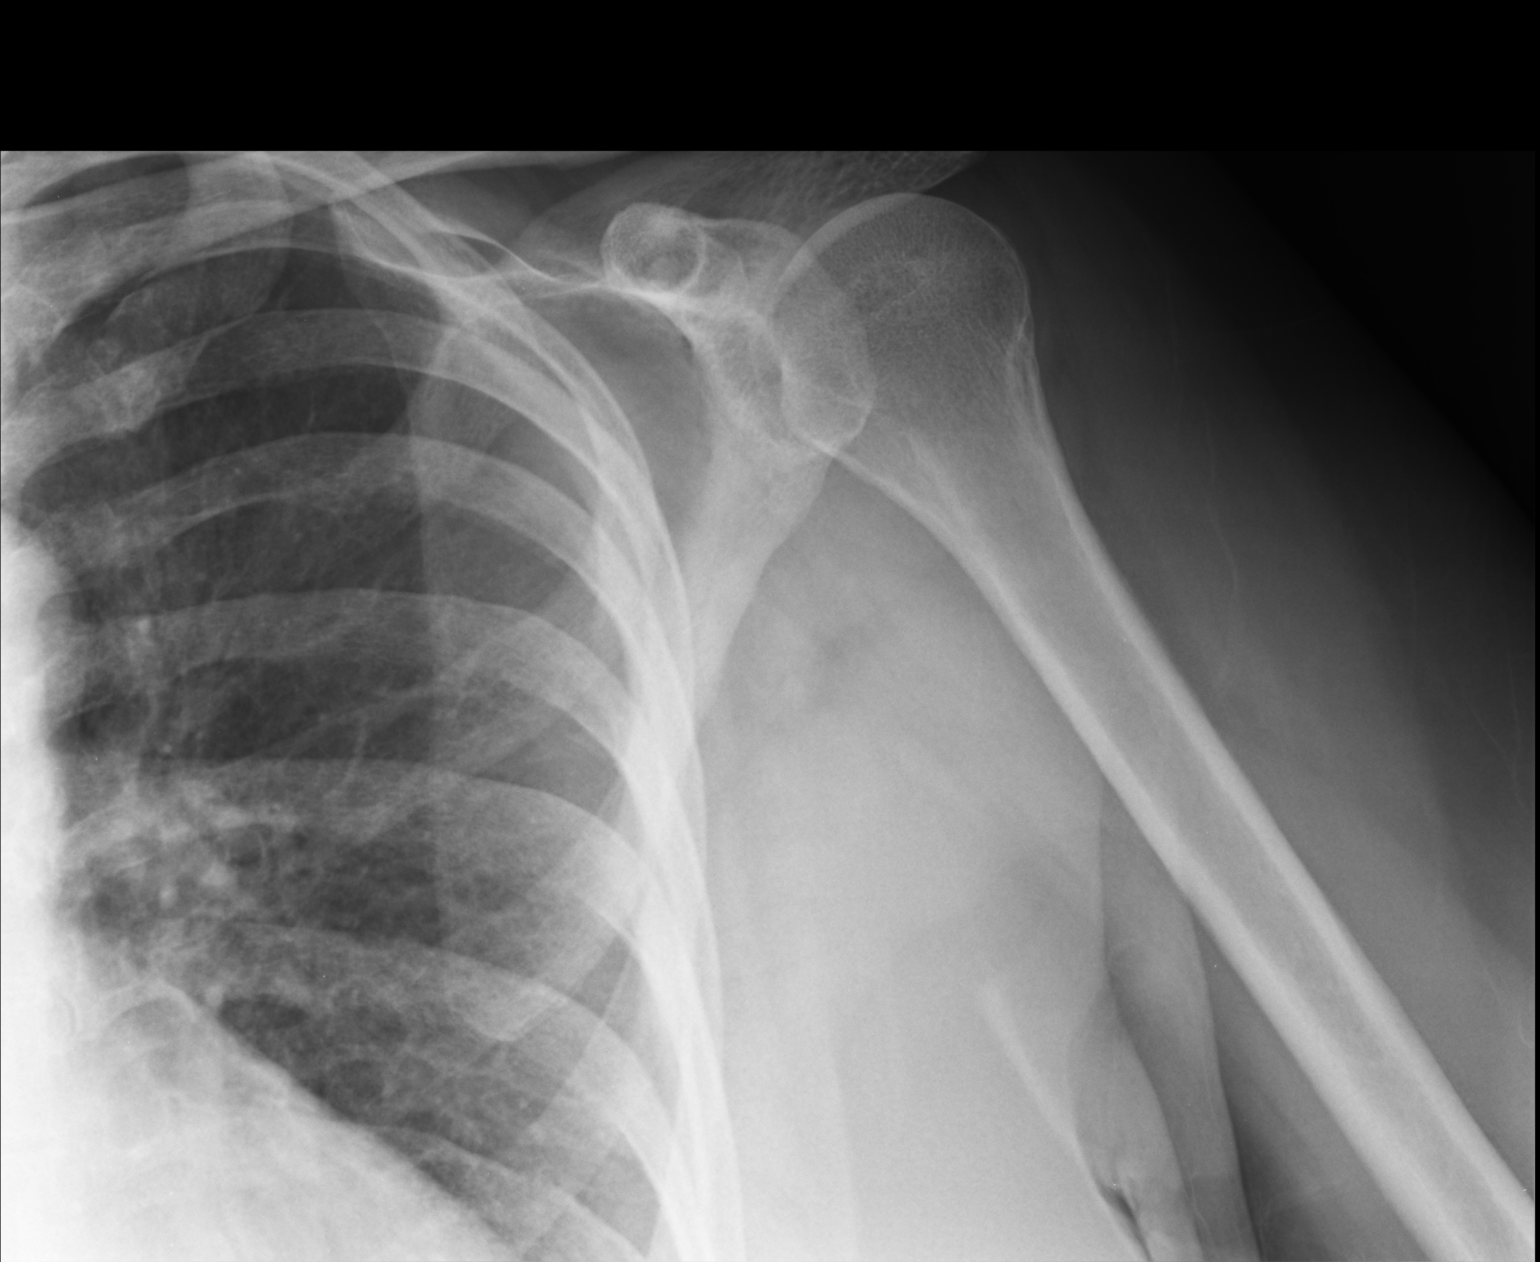

[2 of 2 positions shown; findings below may reference images not displayed]

FINDINGS: The left humeral head is well located within the glenoid fossa. The 
acromioclavicular and coracoclavicular spaces are preserved. No fracture. No 
subluxation. Included portion the left hemithorax is negative.
IMPRESSION: Negative left shoulder.

## 2019-08-10 IMAGING — DX SHOULDER RIGHT 2 VIEWS
1 series · 2 of 2 positions shown · non-contrast
Comparison: None

SHOULDER RIGHT 2 VIEWS, 08/10/2019 [DATE]: 
CLINICAL INDICATION:  Pain, history psoriatic arthritis

[Series 1: ap ext rot · U · 0.12mm/px · 2 of 2 slices shown]
[im 1/2]
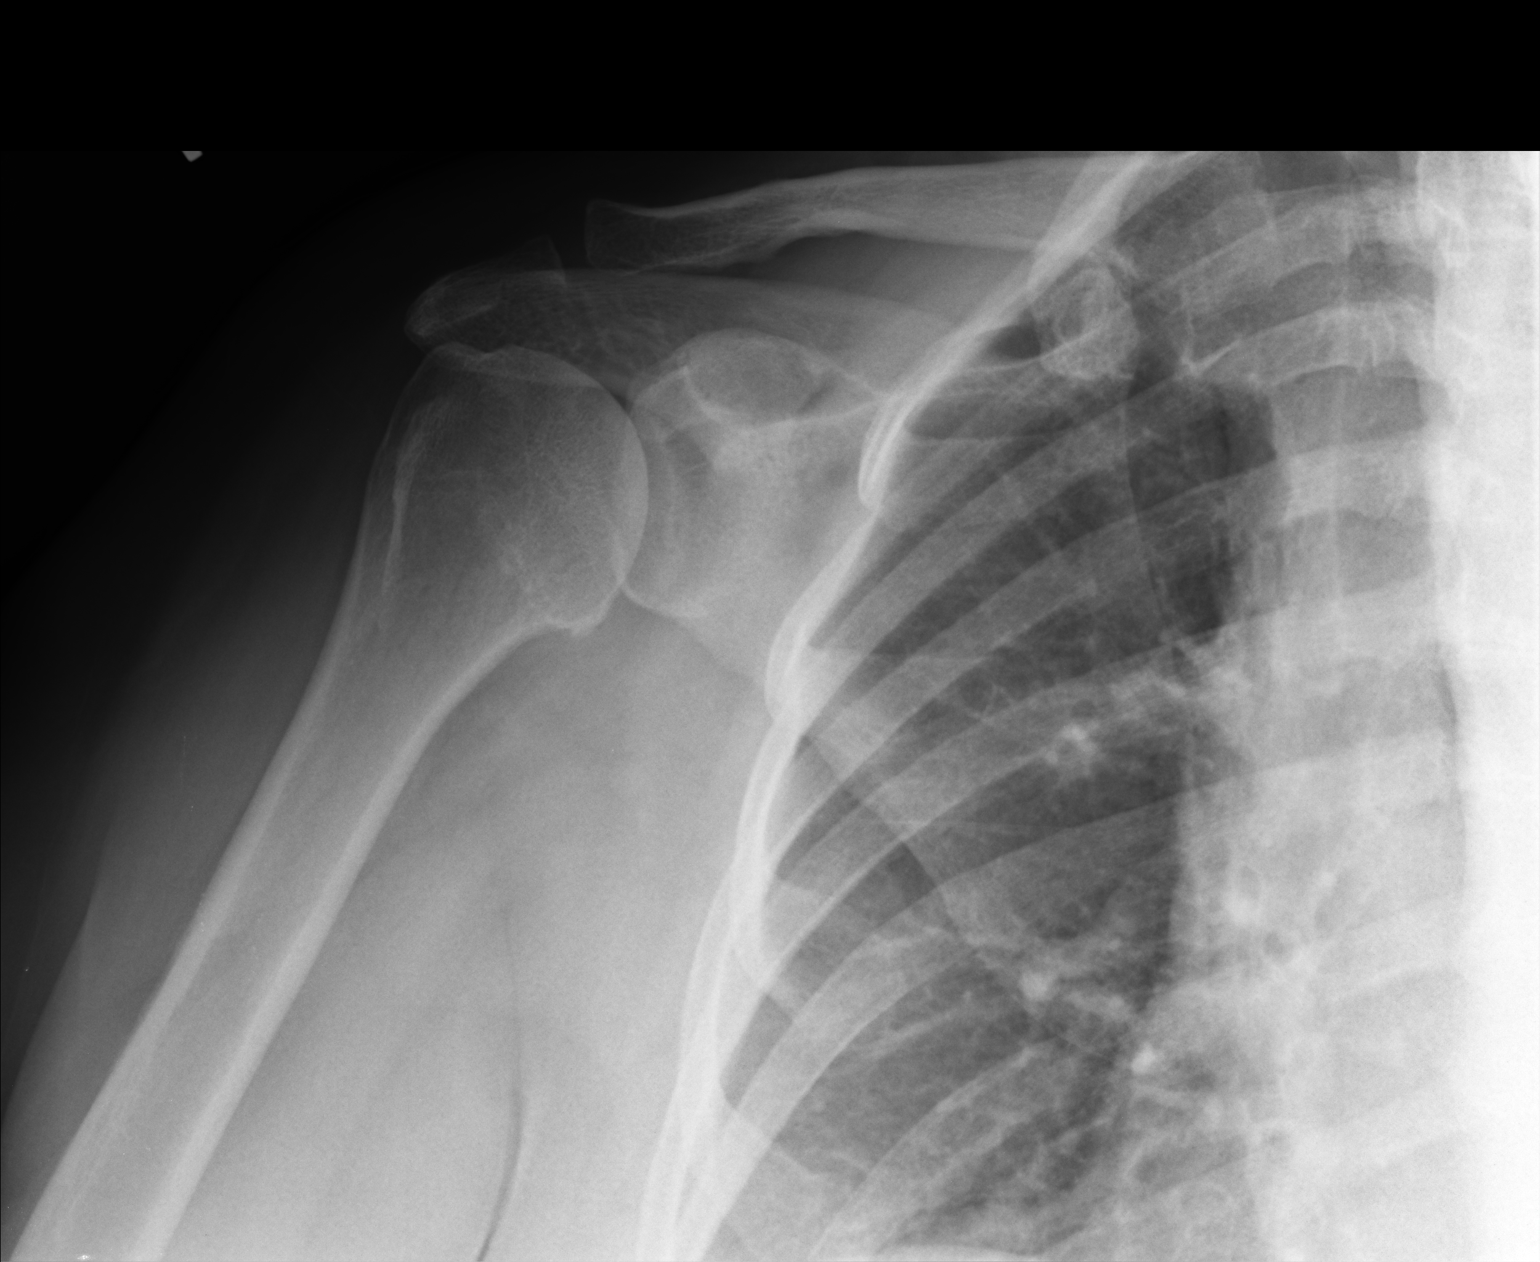
[im 2/2]
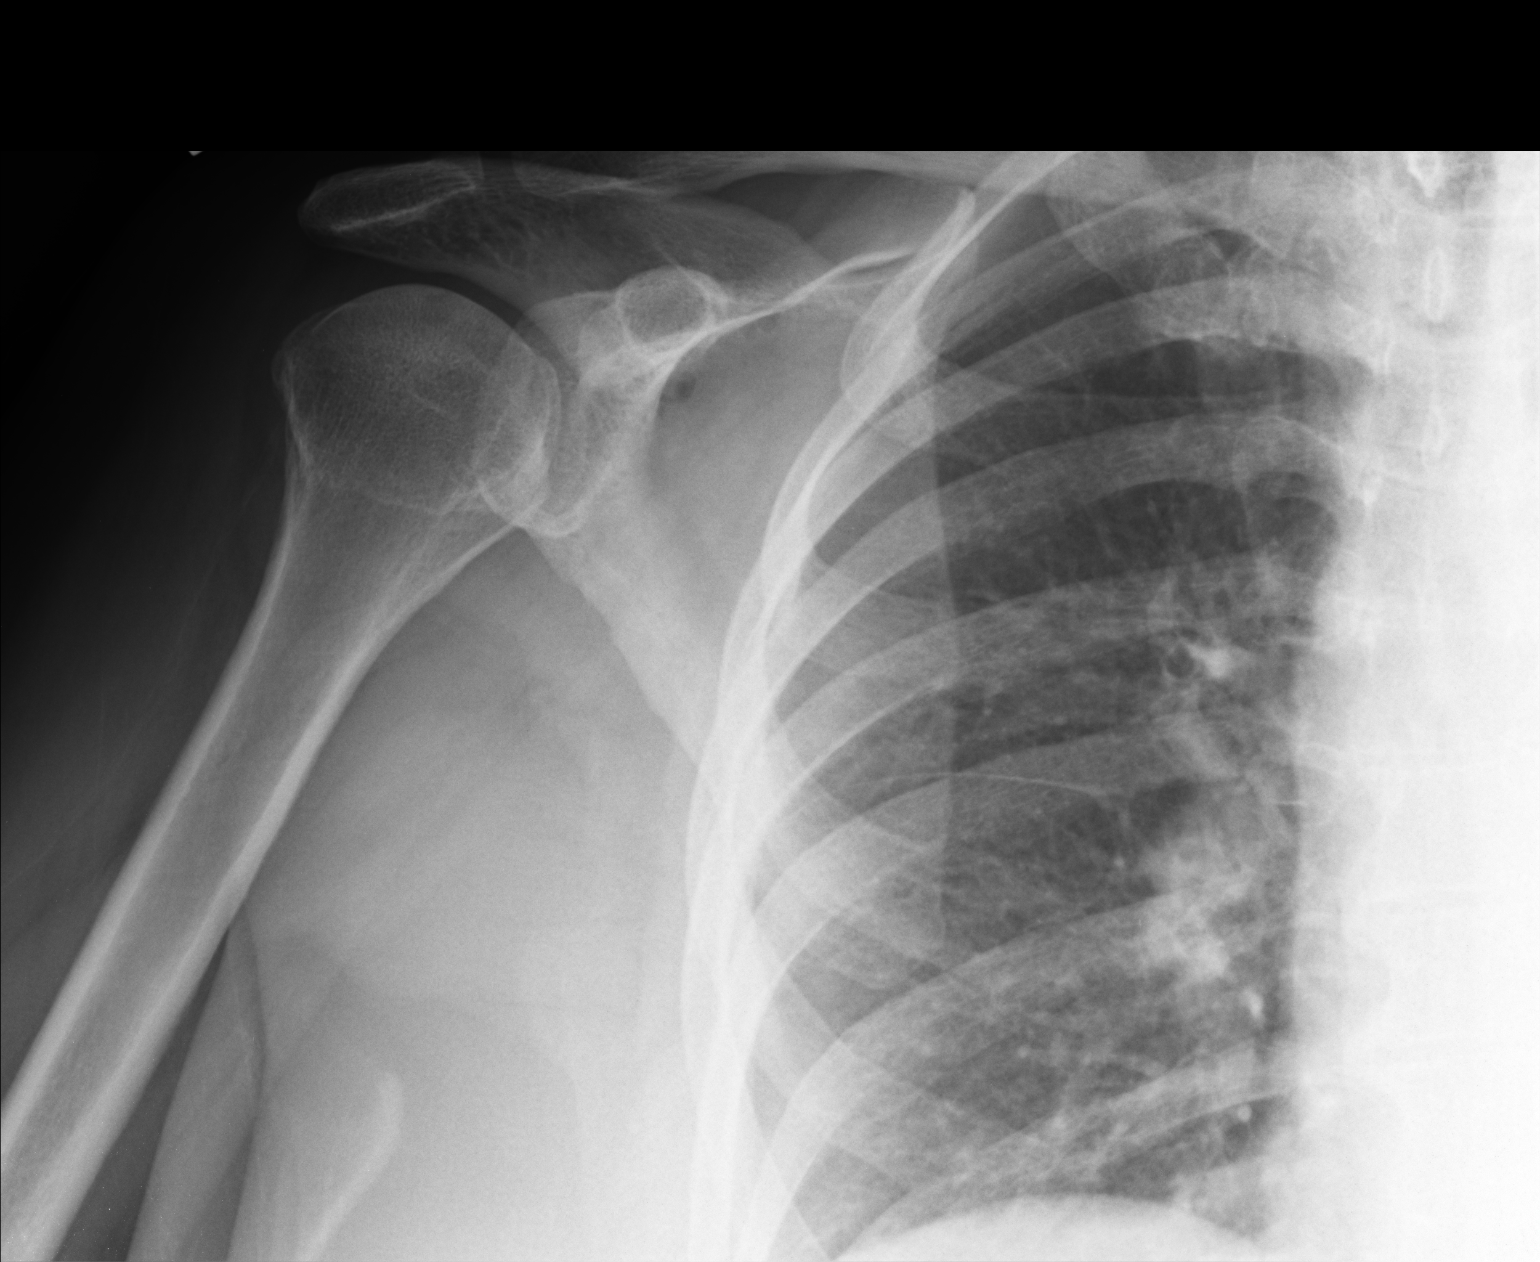

[2 of 2 positions shown; findings below may reference images not displayed]

FINDINGS: There are mild degenerative changes. There is a small spur along the 
inferomedial humeral head, and mild AC joint degenerative change. No fracture, 
dislocation or juxta-articular calcification identified. No bone erosion.
IMPRESSION: Mild osteoarthritic changes. If indicated MRI would be useful for further 
evaluation..

## 2019-08-10 IMAGING — DX HAND 3 VIEWS RIGHT
1 series · 3 of 3 positions shown · non-contrast
Comparison: none

HAND 3 VIEWS LEFT, HAND 3 VIEWS RIGHT, 08/10/2019 [DATE]: 
CLINICAL INDICATION:  Pain. History of psoriatic arthritis. 
COMPARISON EXAMINATIONS: None prior of the hands.

[Series 1: PA · U · 0.12mm/px · 3 of 3 slices shown]
[im 1/3]
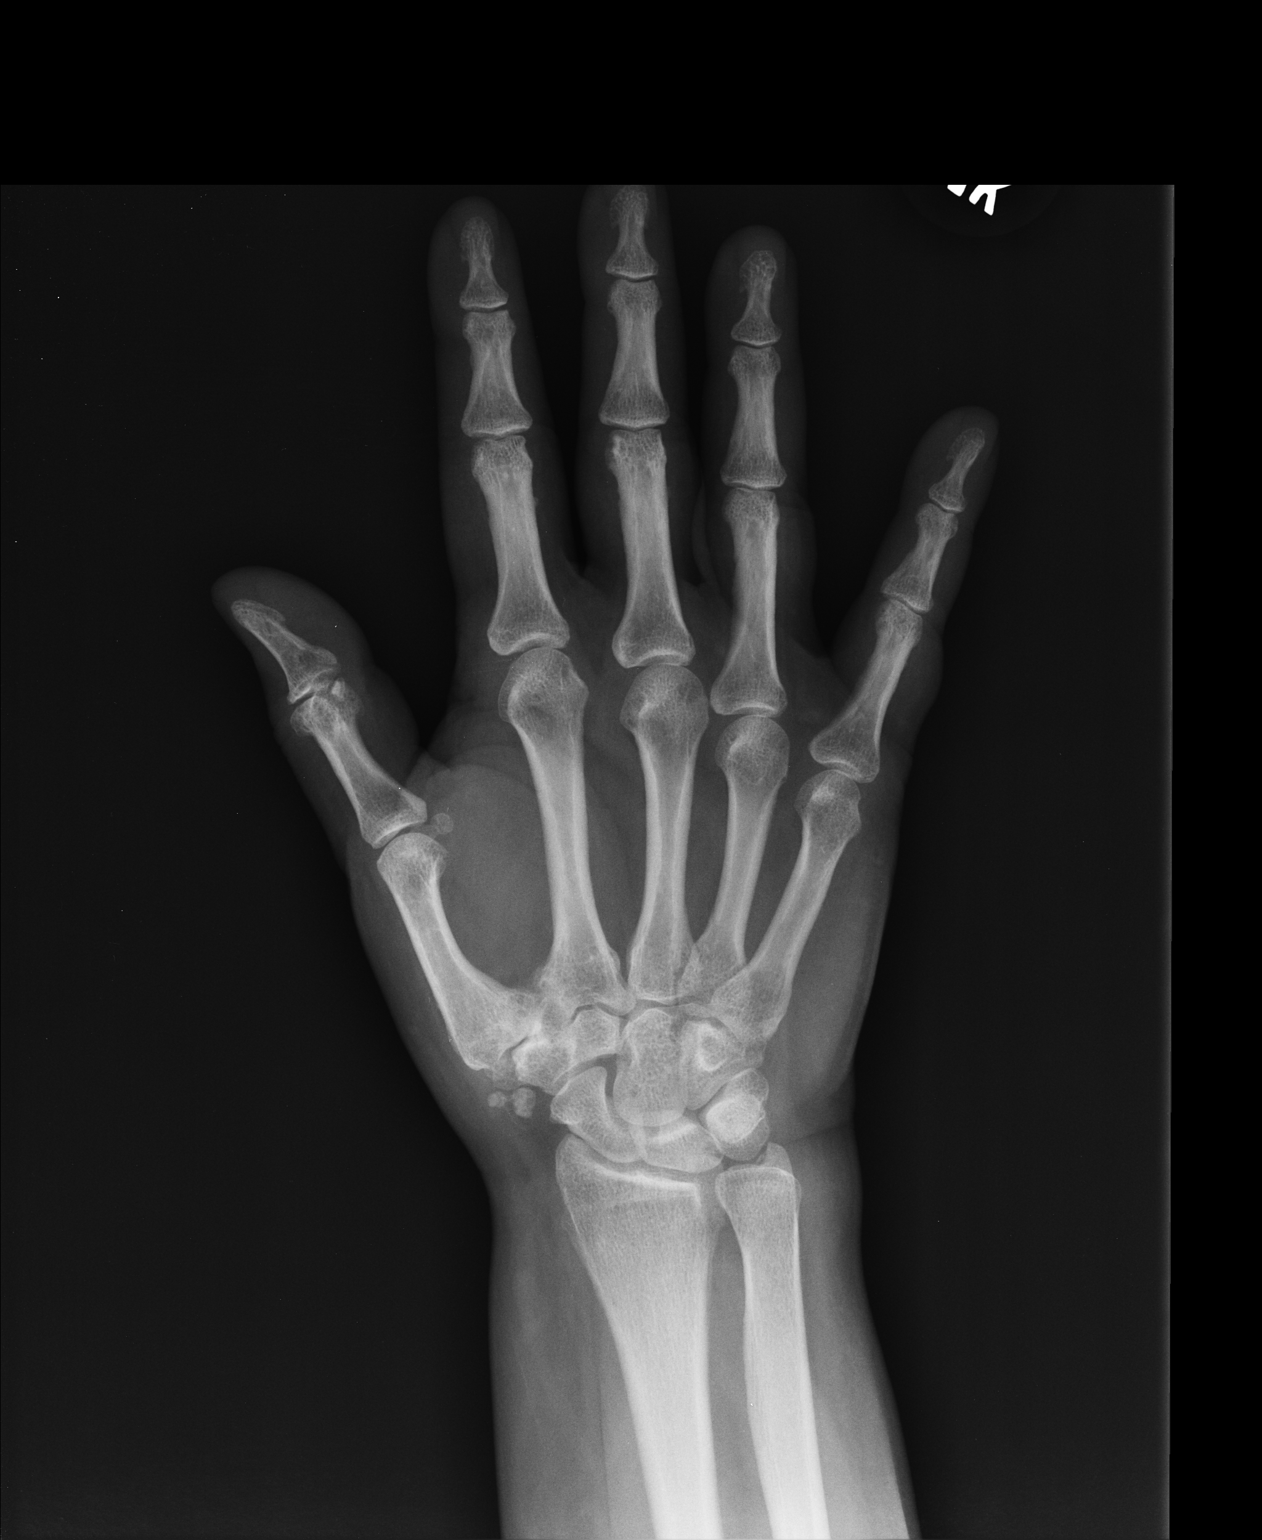
[im 2/3]
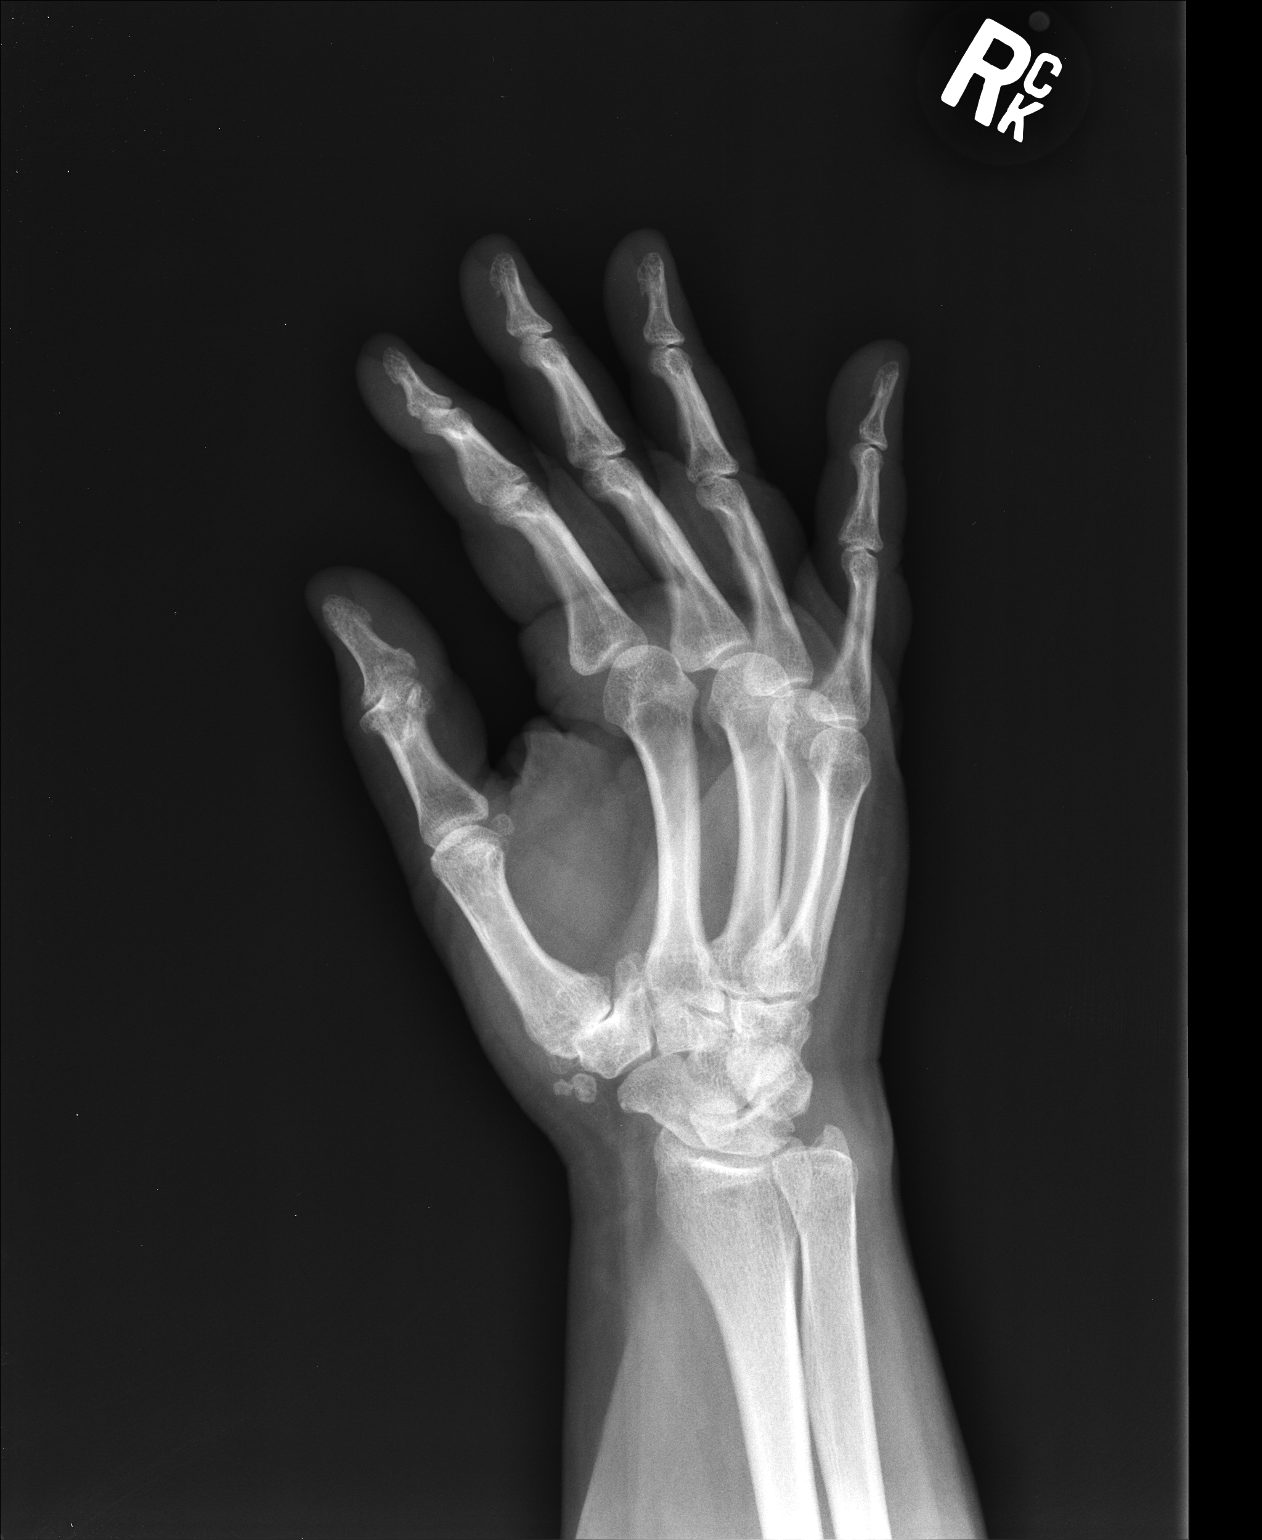
[im 3/3]
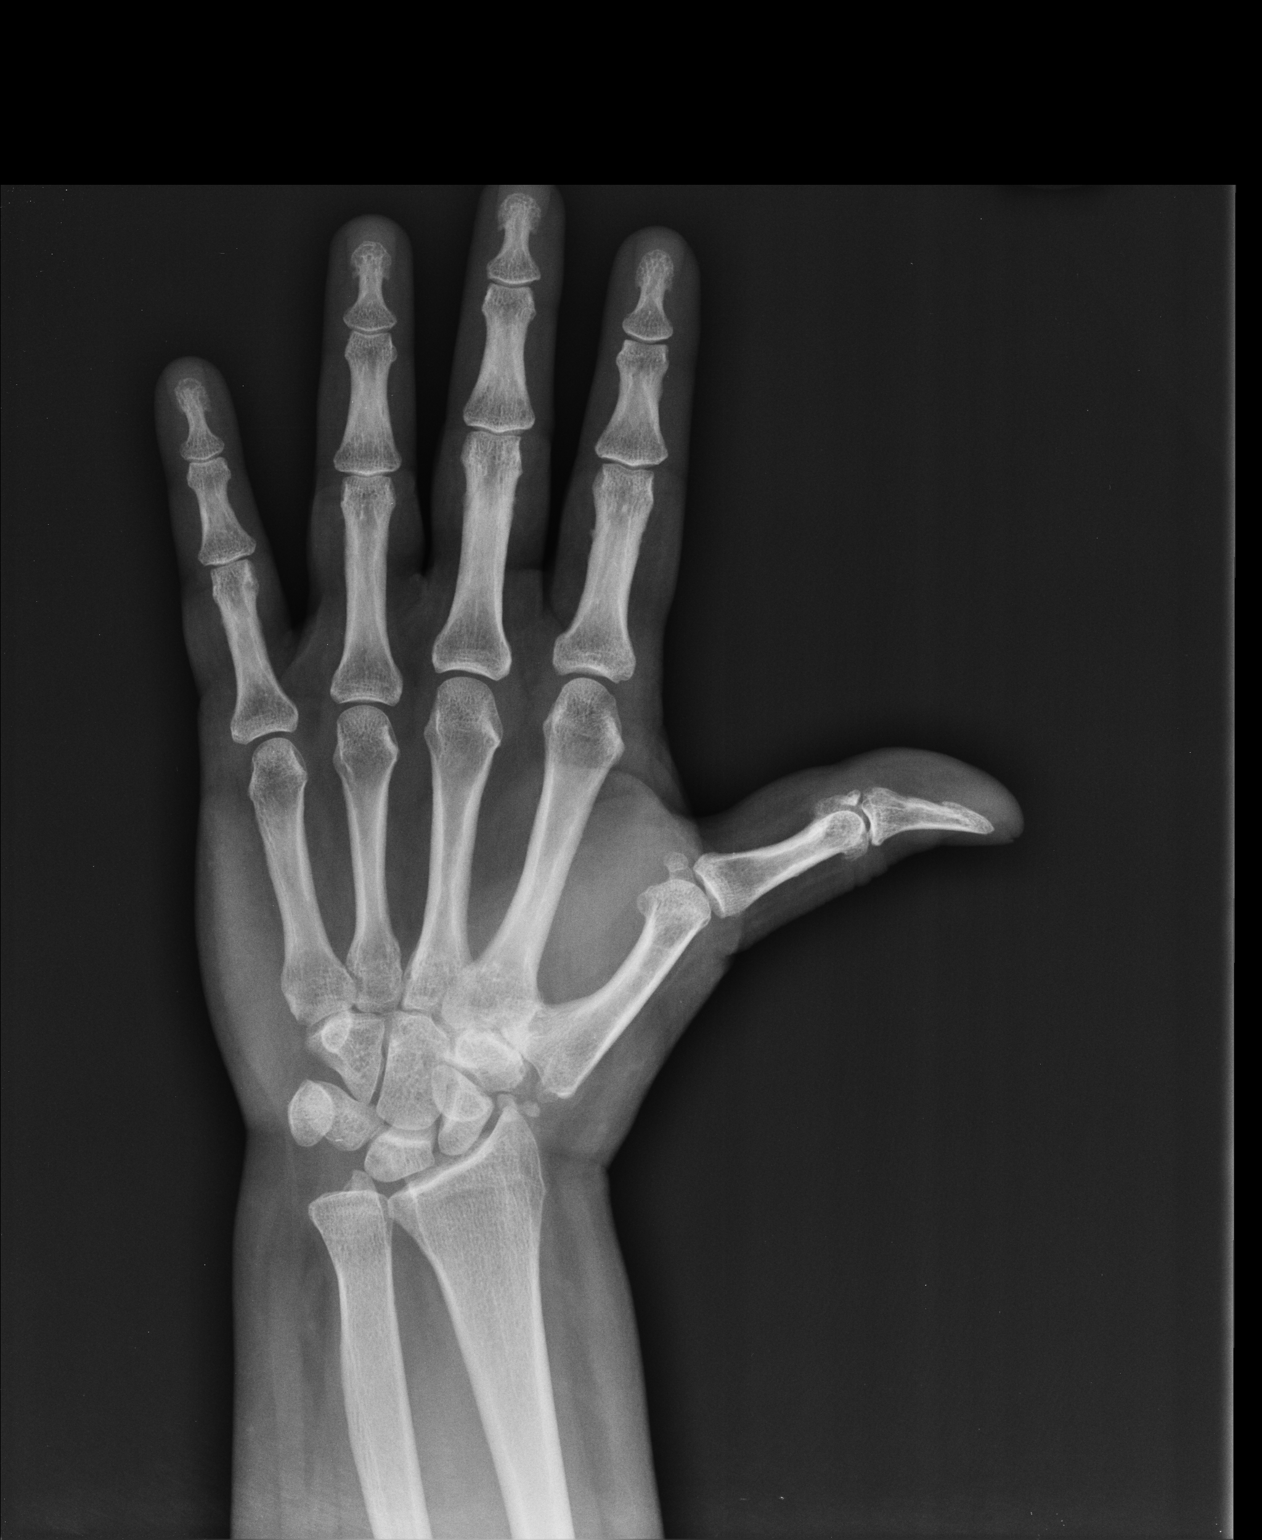

[3 of 3 positions shown; findings below may reference images not displayed]

FINDINGS: Focally advanced joint space narrowing of the bilateral first CMC 
articulations with osseous remodeling, osteophytic spurring, subcortical 
sclerosis and osseous debris. No fracture. No soft tissue swelling. Normal bone 
mineralization.
IMPRESSION: Focally advanced degenerative changes with osteoarthritic appearance involving 
the base of the thumbs.

## 2019-08-10 IMAGING — DX CHEST PA AND LATERAL
1 series · 2 of 2 positions shown · non-contrast
Comparison: none

CLINICAL INDICATION:  Hypertension treated with medication 
COMPARISON EXAMINATION: None
TECHNIQUE: 2 views of the chest

[Series 1: PA · U · 0.17mm/px · 2 of 2 slices shown]
[im 1/2]
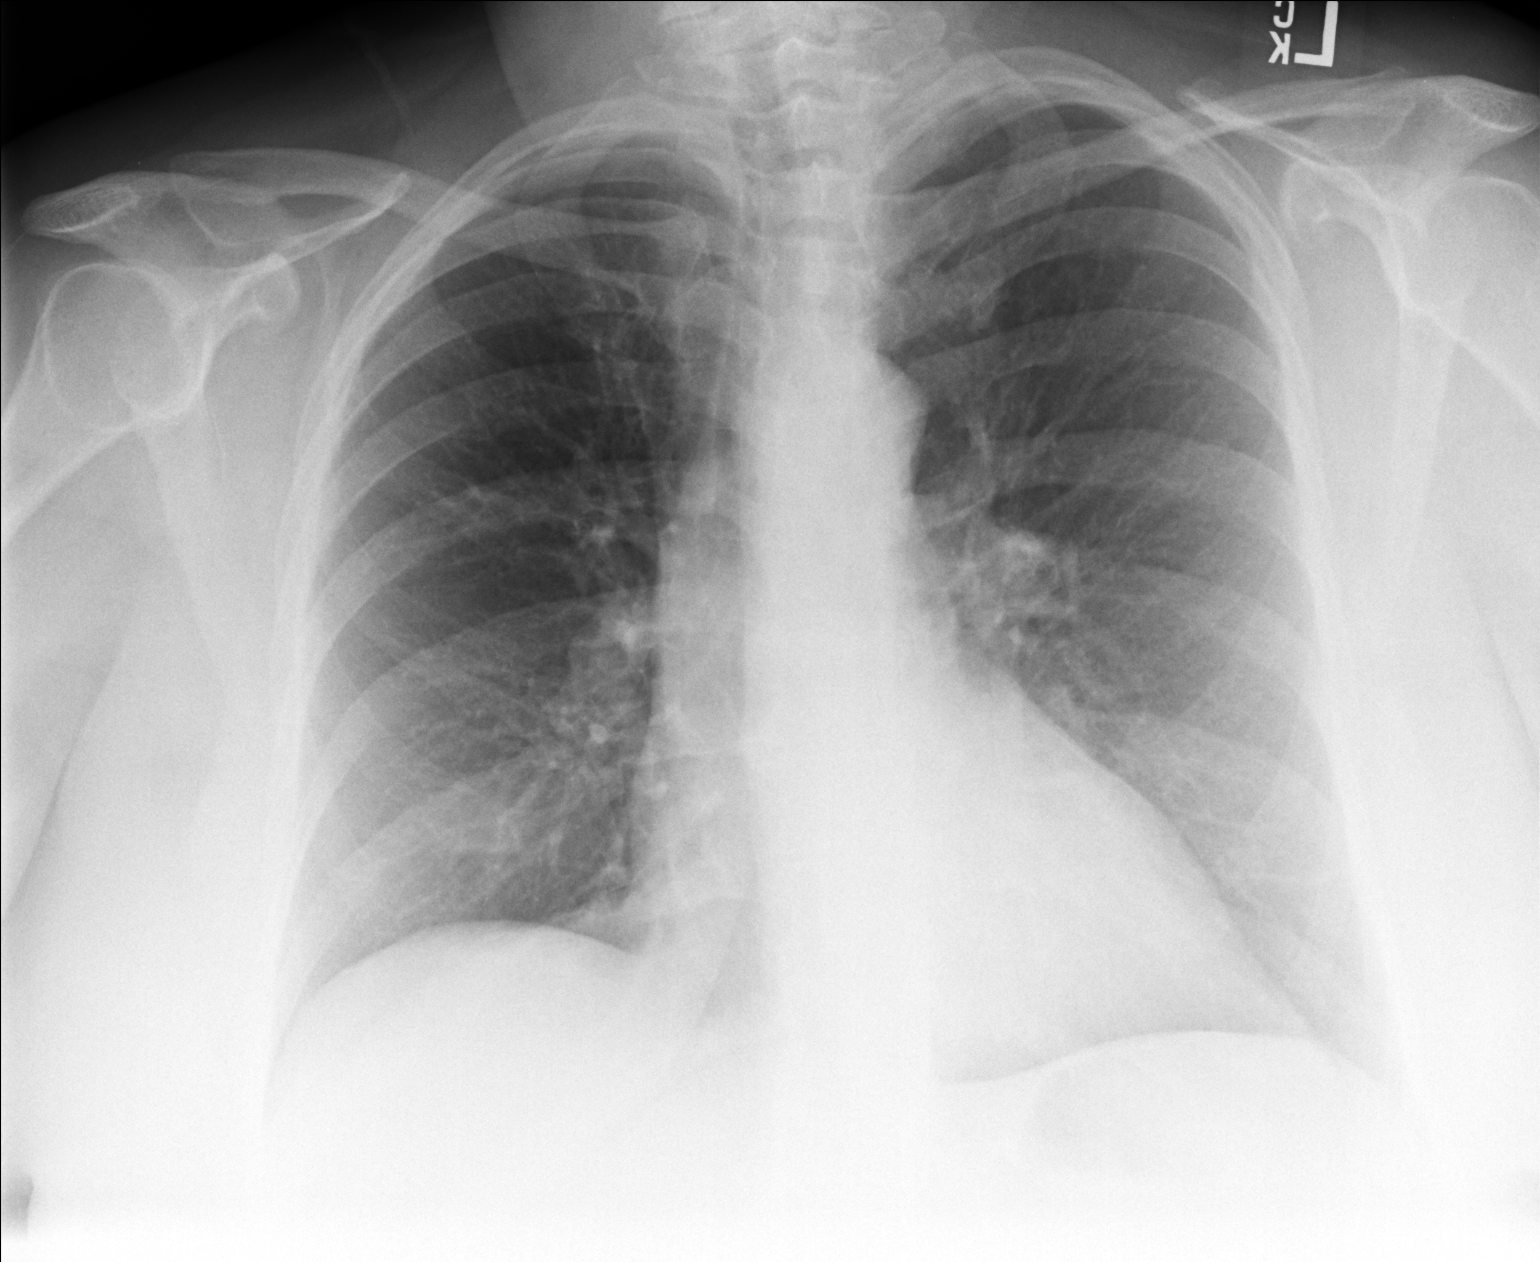
[im 2/2]
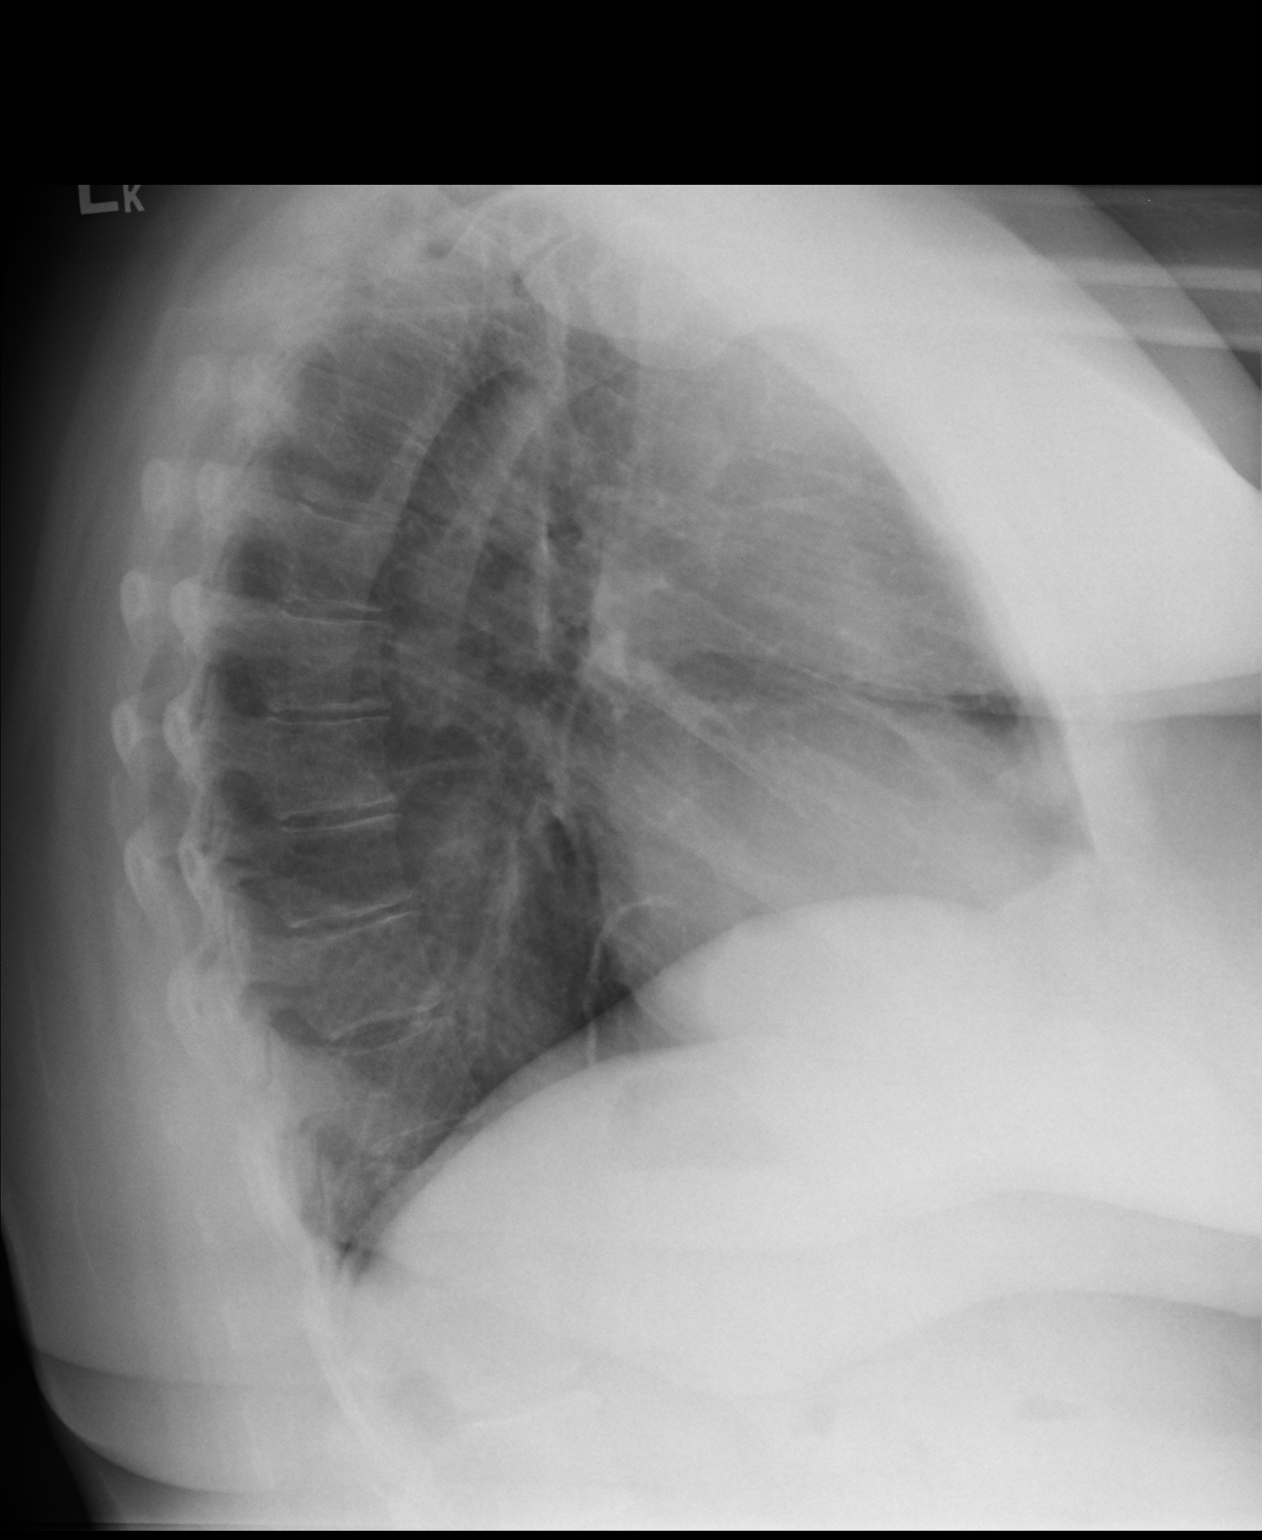

[2 of 2 positions shown; findings below may reference images not displayed]

FINDINGS: Normal heart size. Mild LEFT ventricular configuration suggestive of 
systemic hypertension. Mild central perihilar bronchial wall thickening. No 
effusion. No focal infiltrate. Unremarkable thoracic spine.
IMPRESSION: Probable systemic hypertension. 
Mild bronchitis. No pneumonia.

## 2020-03-03 IMAGING — DX FOOT 3 VIEWS RIGHT
1 series · 3 of 3 positions shown · non-contrast
Comparison: none

FOOT 3 VIEWS RIGHT, 03/03/2020 [DATE]: 
CLINICAL INDICATION:  Right foot pain. 
COMPARISON EXAMINATIONS: None

[Series 1: AP · 0.14mm/px · 3 of 3 slices shown]
[im 1/3]
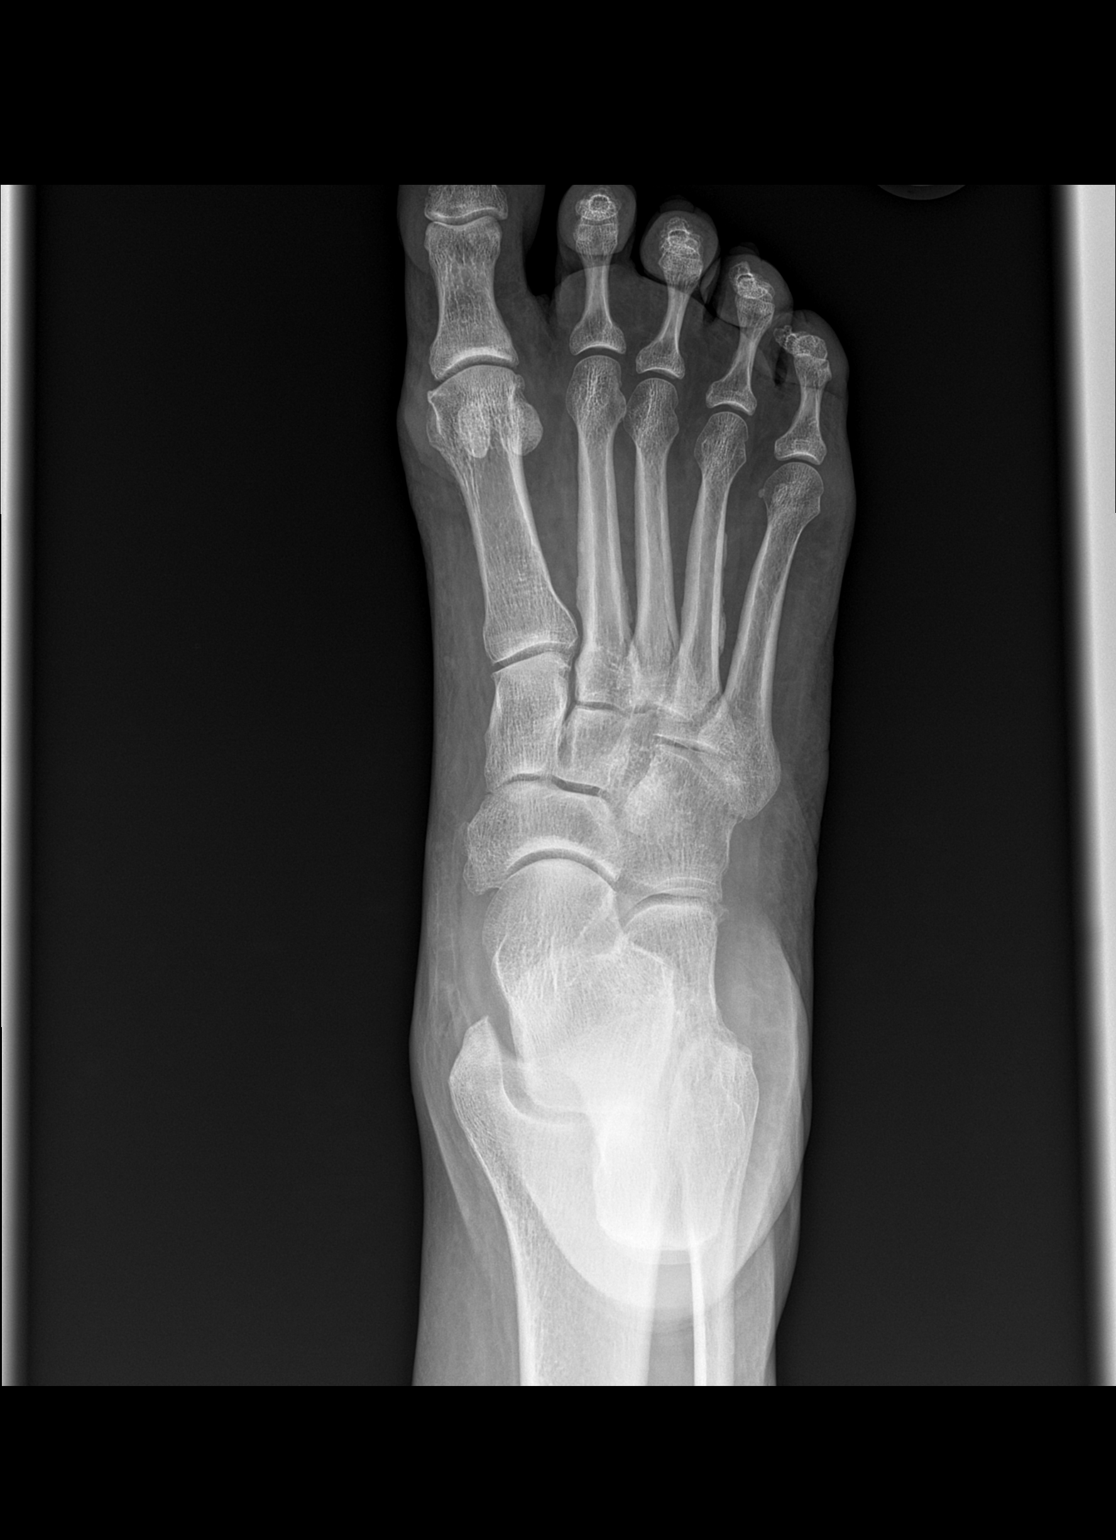
[im 2/3]
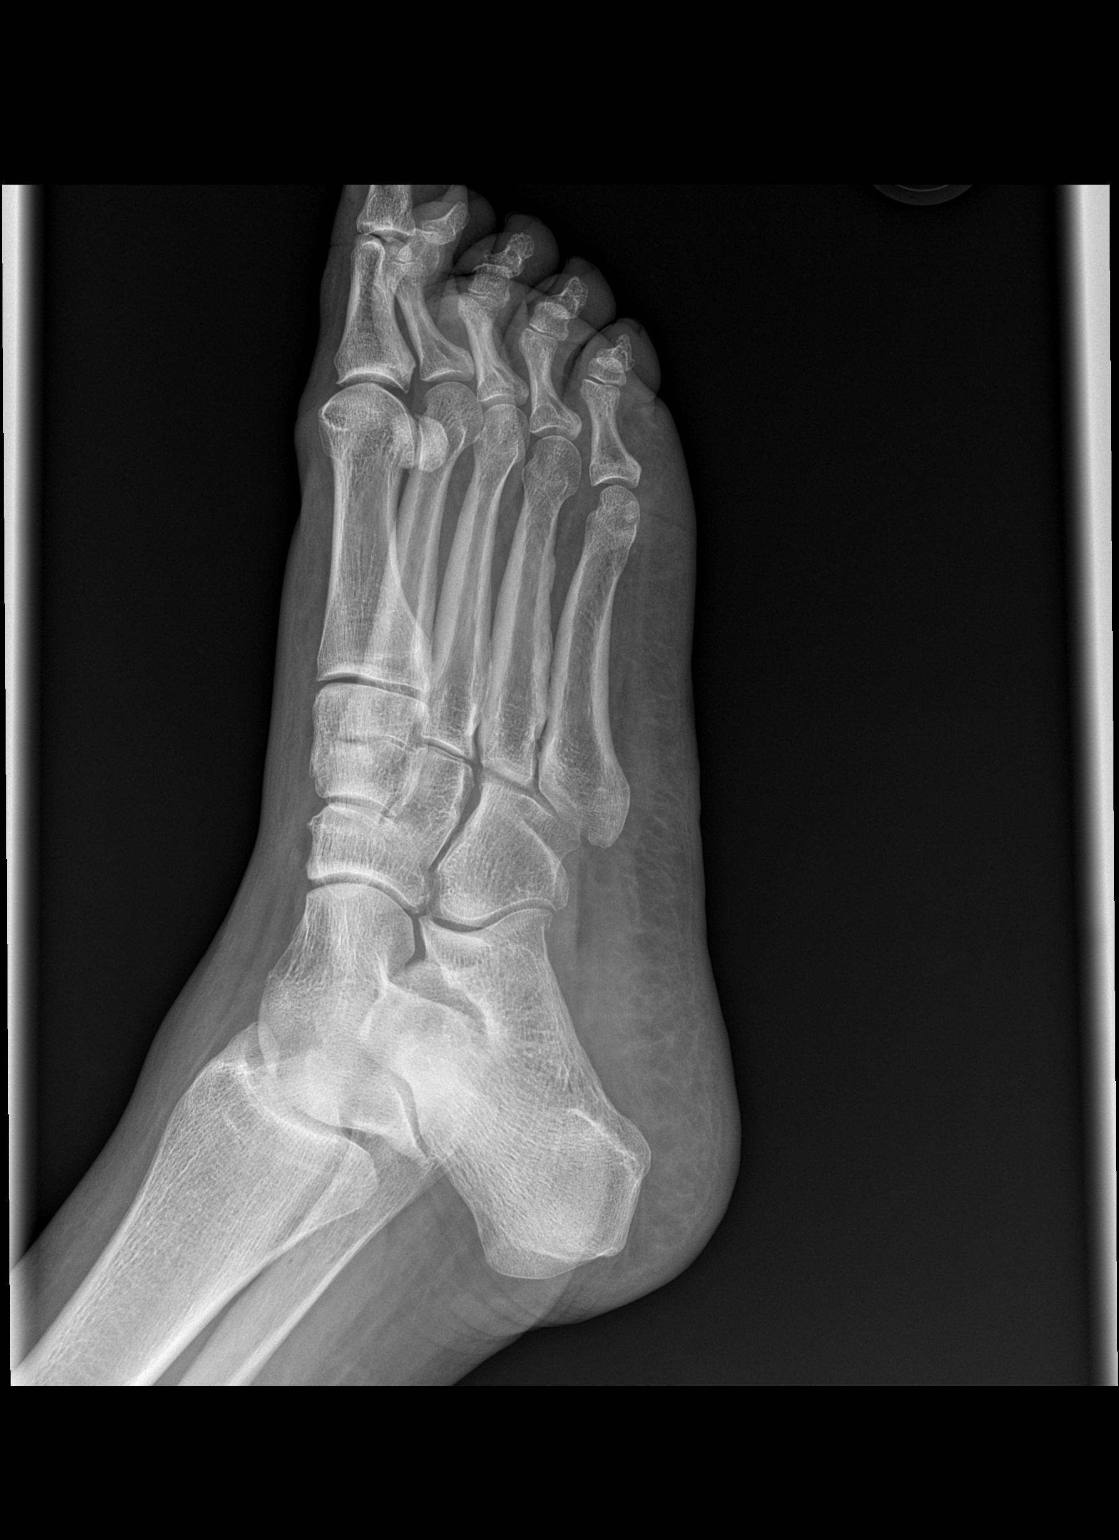
[im 3/3]
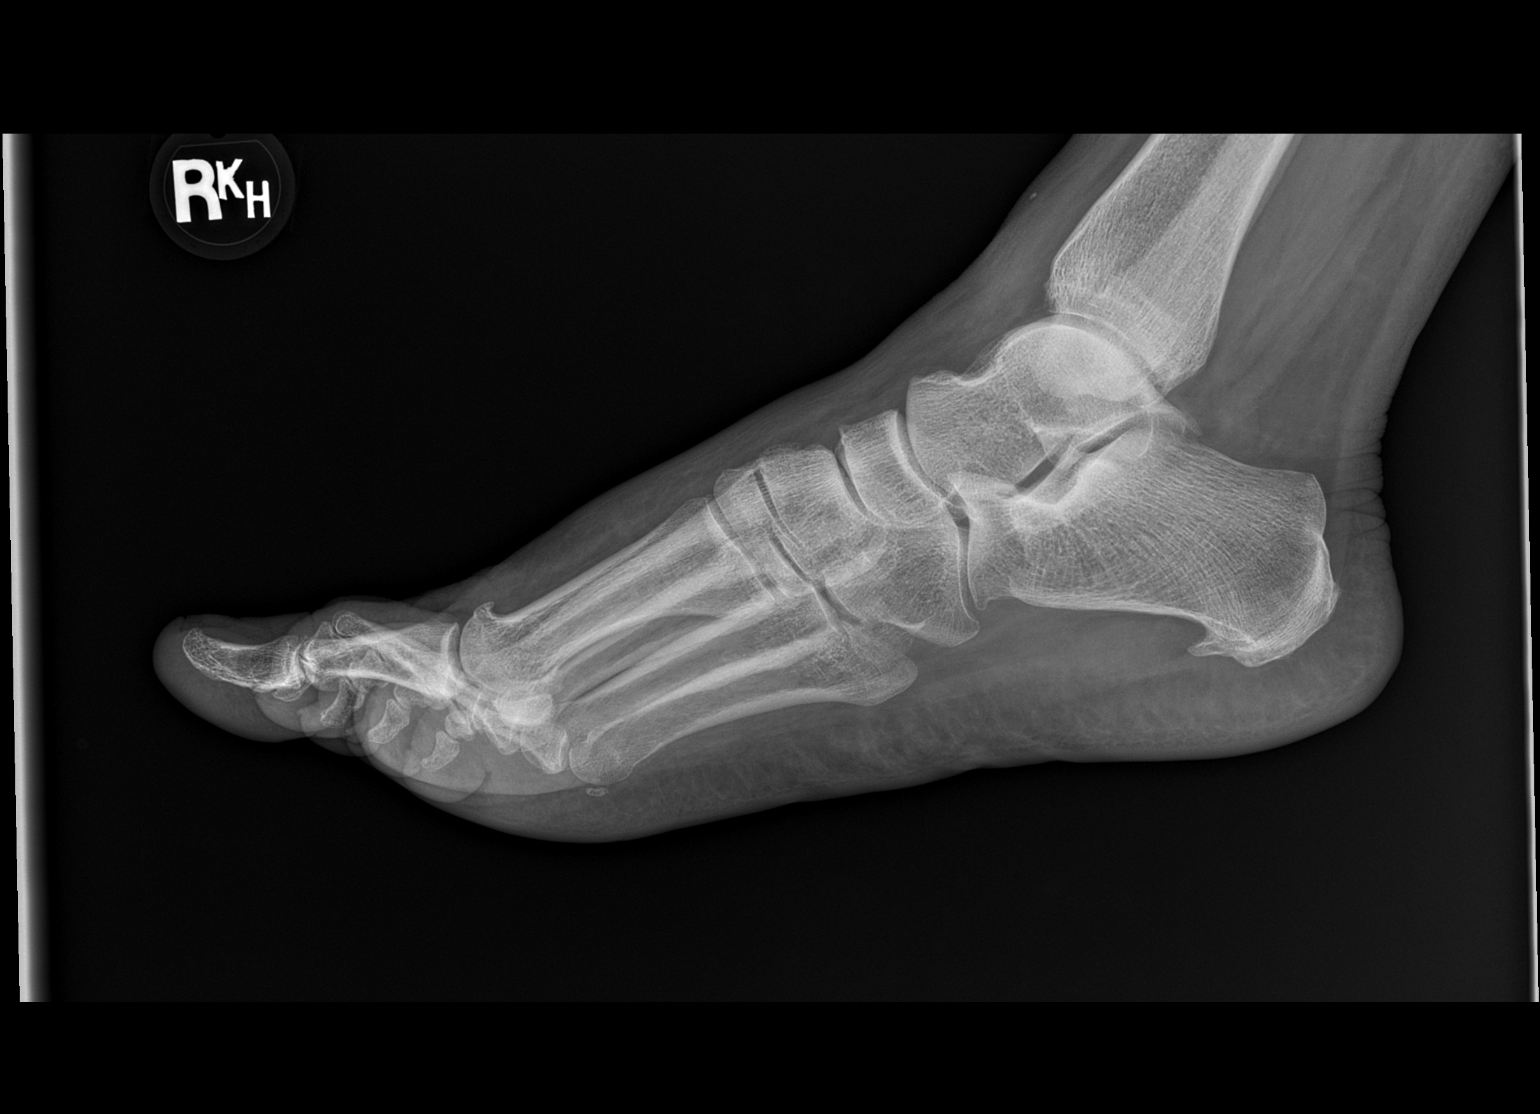

[3 of 3 positions shown; findings below may reference images not displayed]

FINDINGS: No fracture. Normal alignment. Mild degenerative change within the 
joint space is. No erosion. No focal soft tissue swelling. Small calcaneal spur.
IMPRESSION: Mild degenerative change. No fracture or evidence of erosion..

## 2022-09-08 IMAGING — MR MRI RIGHT HAND WITHOUT CONTRAST
4 of 6 series · 10 of 40 positions shown · IV contrast (gadolinium)
Comparison: None.

________________________________________________________________________________________________ 
MRI RIGHT WRIST WITHOUT CONTRAST, MRI RIGHT HAND WITHOUT CONTRAST, 09/08/2022 
[DATE]: 
CLINICAL INDICATION: Arthropathic psoriasis, unspecified.
TECHNIQUE: Multiplanar, multiecho position MR images of the right hand and wrist 
were performed without intravenous gadolinium enhancement. Patient was scanned 
on a 1.5T magnet.

[Series 201: survey right · coronal · right · 10.0mm · 0.56mm/px · 3 of 15 slices shown]
[im 1/15]
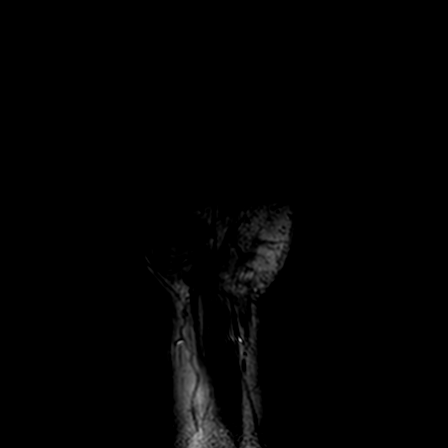
[im 10/15]
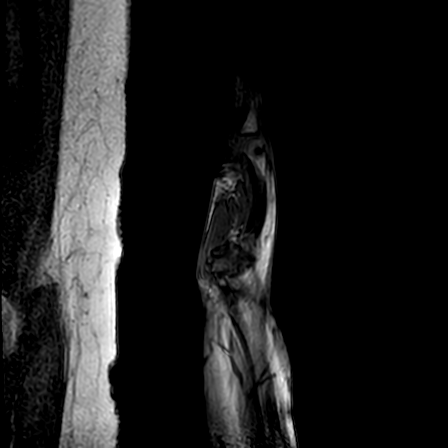
[im 15/15]
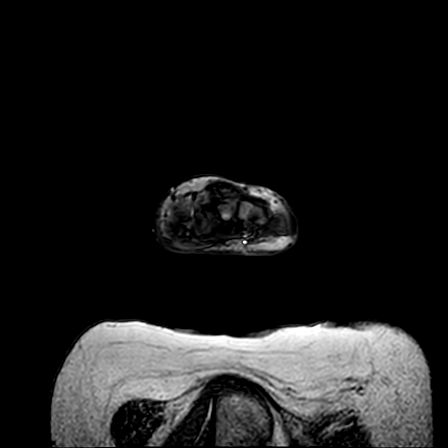

[Series 1101: T1 · axial · right · 3.0mm · 0.15mm/px · z∈[-112,+9]mm · 3 of 49 slices shown (1 of 2)]
[im 9/49]
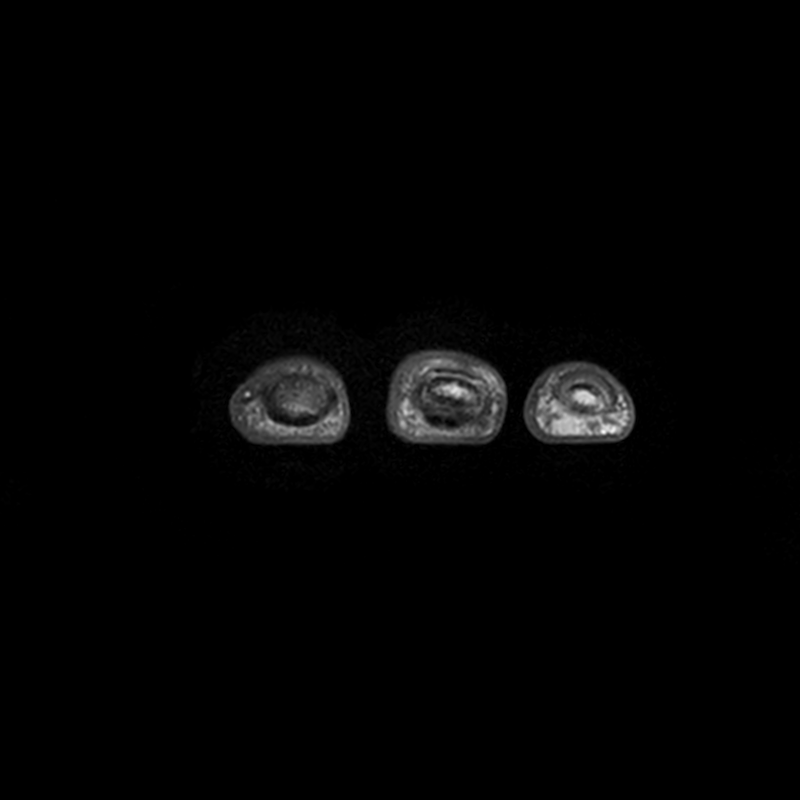
[im 27/49]
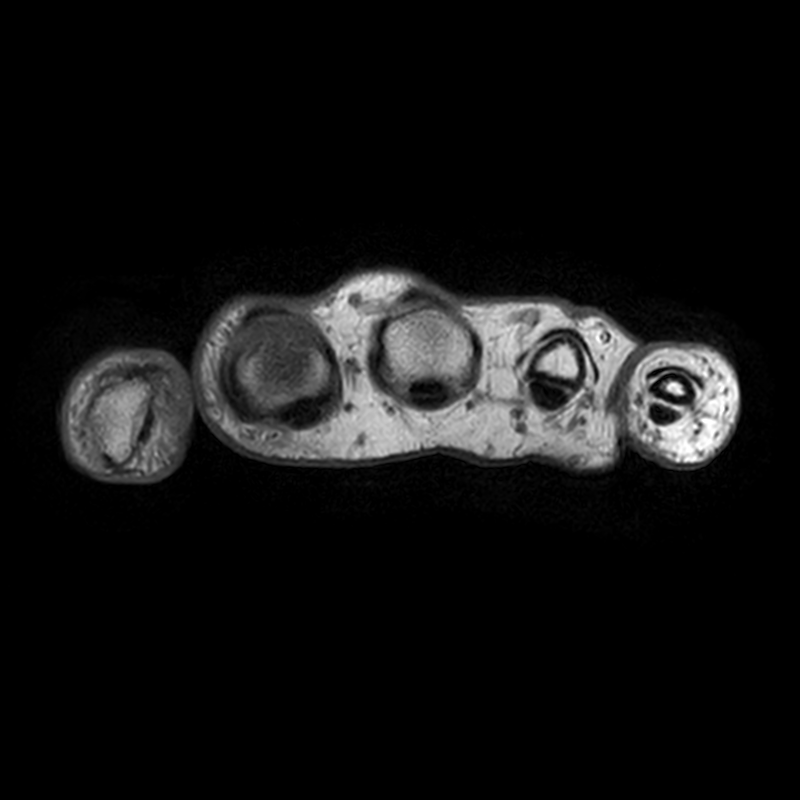
[im 44/49]
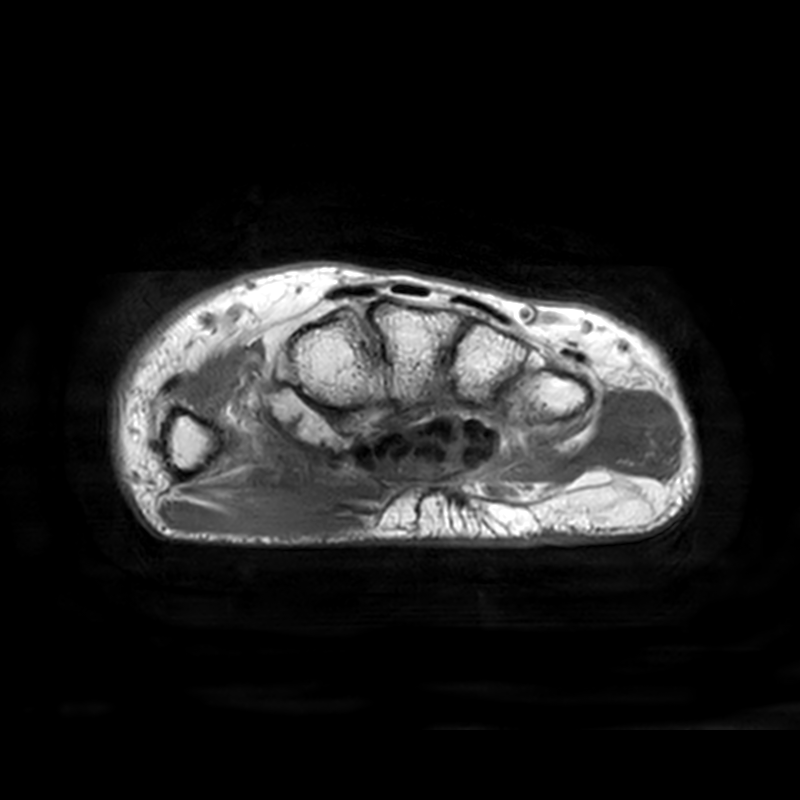

[Series 1201: T1 · coronal · right · 2.0mm · 0.21mm/px · 3 of 19 slices shown (2 of 2)]
[im 1/19]
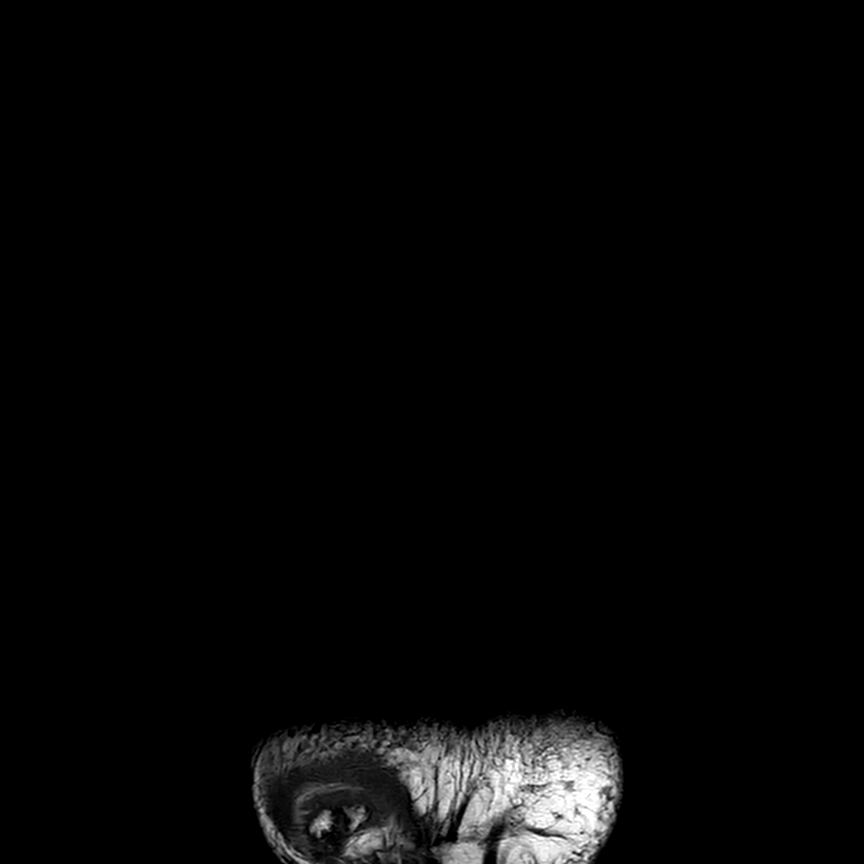
[im 13/19]
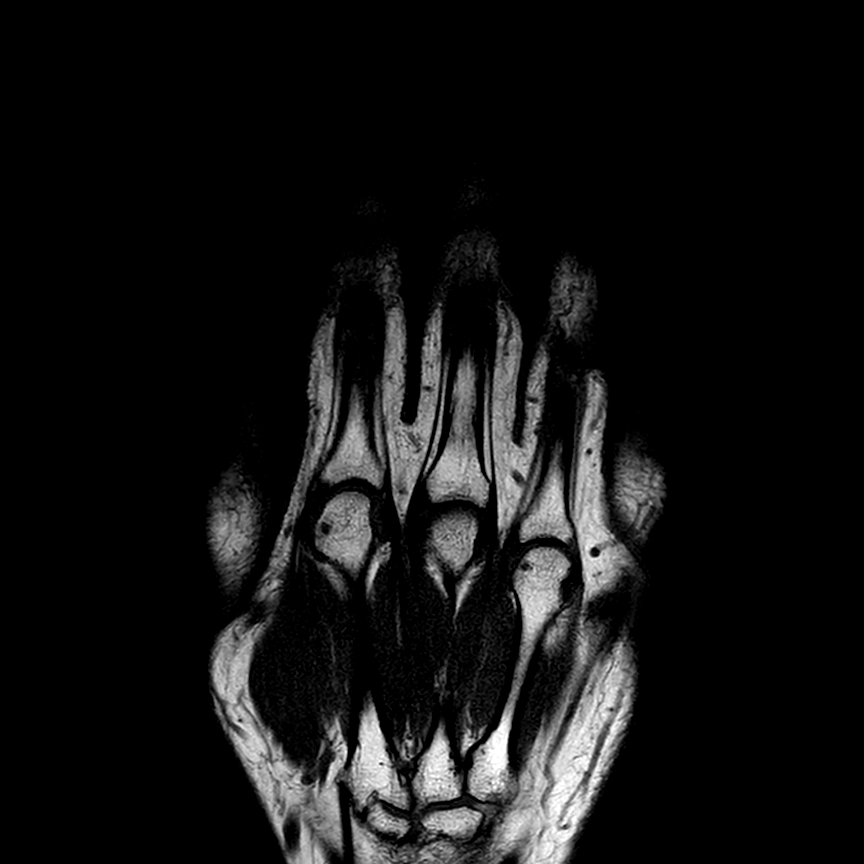
[im 19/19]
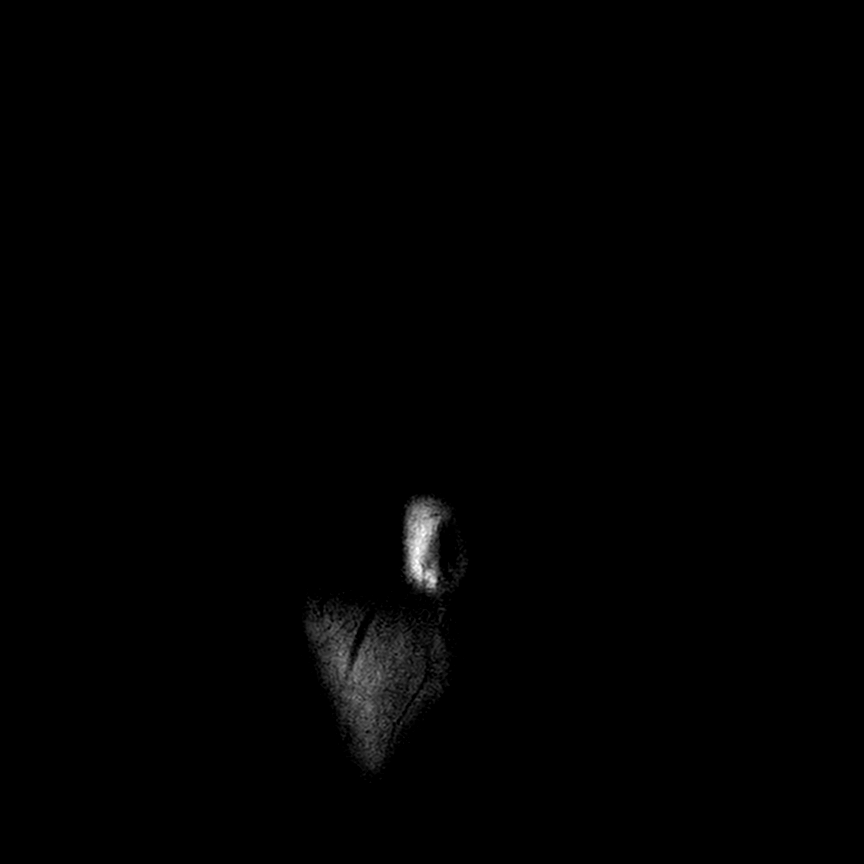

[Series 1401: (person_name) right · axial · right · 3.0mm · 0.23mm/px · 1 of 49 slices shown]
[im 5/49]
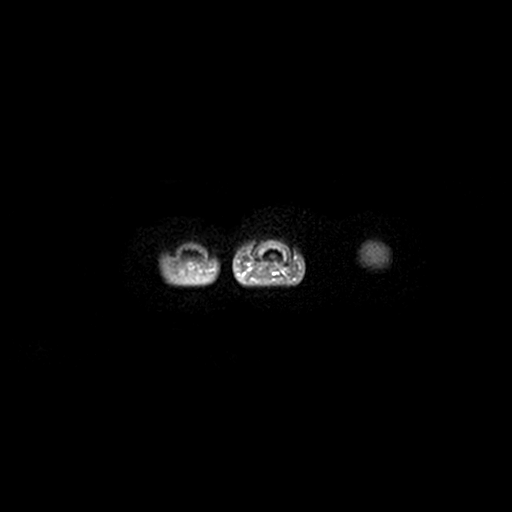

[10 of 40 positions shown; findings below may reference images not displayed]

FINDINGS: BONES AND JOINTS: No periostitis, erosions, marrow edema, fracture or stress 
response. Marked thumb carpometacarpal (696-8) joint degenerative change and 
mild degenerative change of the scaphotrapeziotrapezoid (STT) complex, thumb 
metacarpophalangeal (MCP) and several interphalangeal (IP) joints. Subcortical 
cystic change and flattening of the proximal medial lunate, which abuts the 
distal ulna, consistent with ulnolunate abutment. Subcortical cystic change of 
the metacarpal heads, most marked involving the thumb and second. Small 
additional carpal cysts. Normal alignment. The distal ulna is well located 
within the radial sigmoid notch. No joint effusion. No discrete synovitis. 
LIGAMENTS: The scapholunate ligament is preserved. The lunotriquetral ligament 
is not well delineated, suspicious for tear. Full-thickness tear of the radial 
attachments/central body of the TFCC disc. The volar and dorsal components of 
the radioulnar ligament of the TFCC are intact. The ulnocarpal components of the 
TFCC are intact. The extrinsic ligaments are grossly intact.  
TENDONS: The flexor and extensor tendons are preserved. No tendinosis, tear or 
tenosynovitis. The flexor retinaculum is not bowed. 
SOFT TISSUES: Musculature is symmetric without mass, signal abnormality or 
atrophy. Neurovascular bundles are negative. Specifically, the ulnar nerve is 
negative at the level of Brouwer canal. No focal fluid collection or suggestion of 
ganglion. No focal soft tissue swelling.
IMPRESSION: 1.  No specific findings for arthropathic psoriasis. 
2.  Multifocal degenerative change, most marked involving the 696-8 joint. 
3.  Ulnolunate abutment, full-thickness tear of the TFCC disc and probable 
lunotriquetral ligament tear.

## 2022-09-08 IMAGING — MR MRI RIGHT WRIST WITHOUT CONTRAST
4 of 6 series · 17 of 40 positions shown · IV contrast (gadolinium)
Comparison: None.

________________________________________________________________________________________________ 
MRI RIGHT WRIST WITHOUT CONTRAST, MRI RIGHT HAND WITHOUT CONTRAST, 09/08/2022 
[DATE]: 
CLINICAL INDICATION: Arthropathic psoriasis, unspecified.
TECHNIQUE: Multiplanar, multiecho position MR images of the right hand and wrist 
were performed without intravenous gadolinium enhancement. Patient was scanned 
on a 1.5T magnet.

[Series 501: T1 · axial · right · 3.0mm · 0.14mm/px · z∈[+7,+70]mm · 3 of 25 slices shown]
[im 4/25]
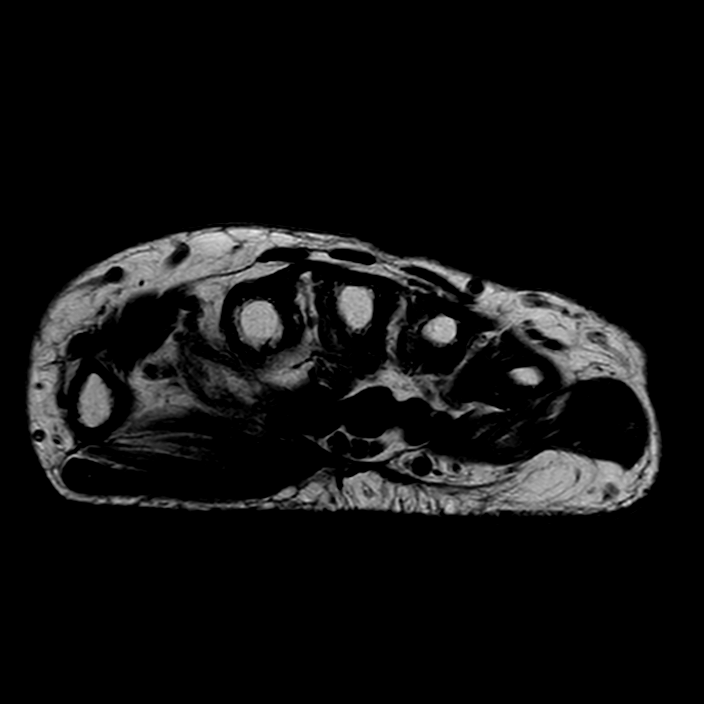
[im 13/25]
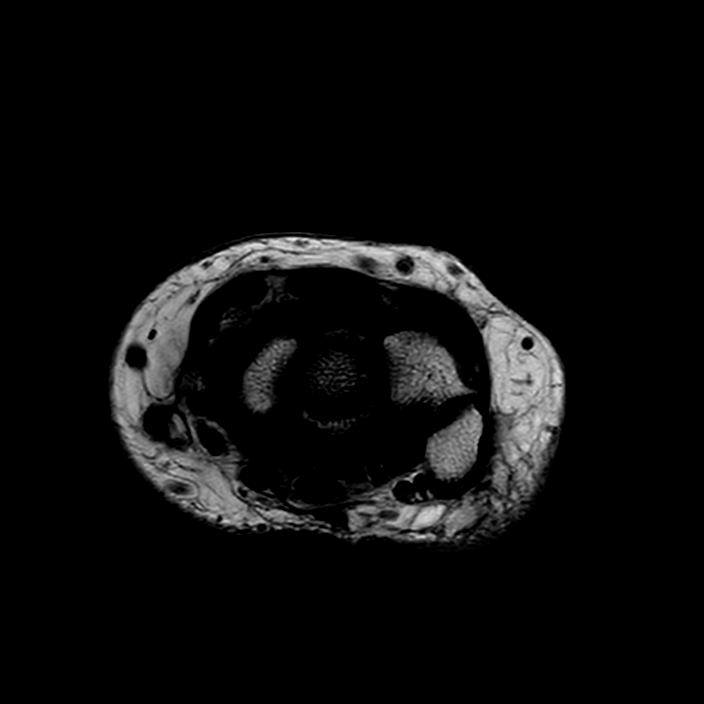
[im 22/25]
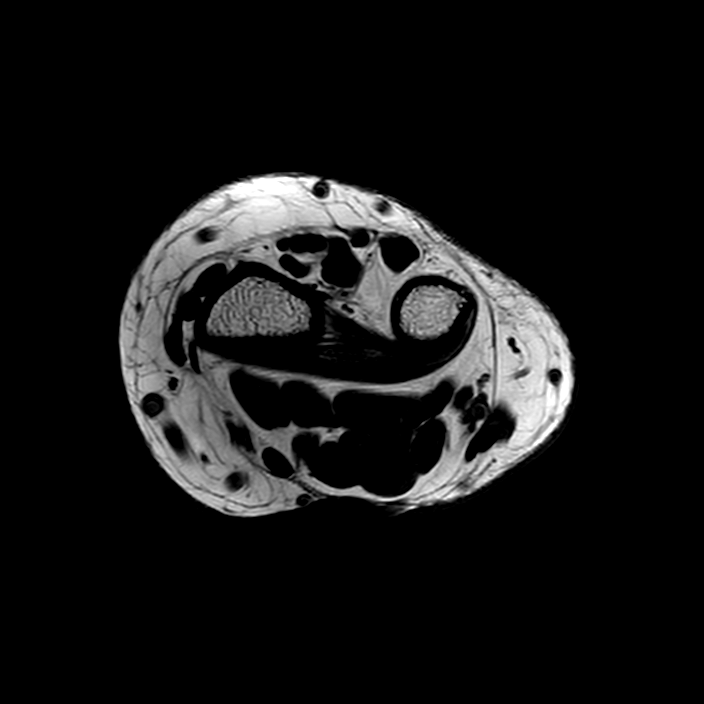

[Series 601: PD fat-sat · axial · right · 3.0mm · 0.19mm/px · z∈[-3,+70]mm · 8 of 25 slices shown (1 of 3)]
[im 1/25]
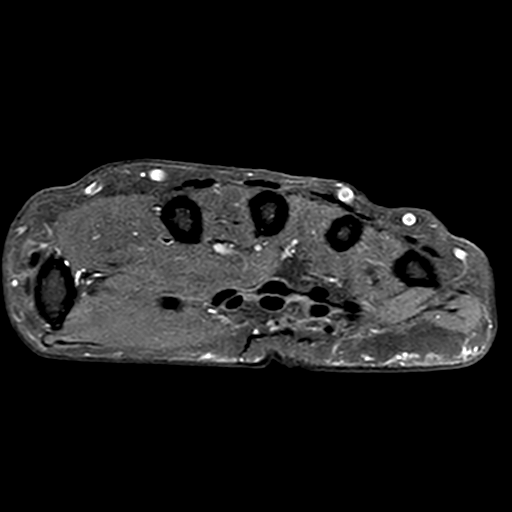
[im 4/25]
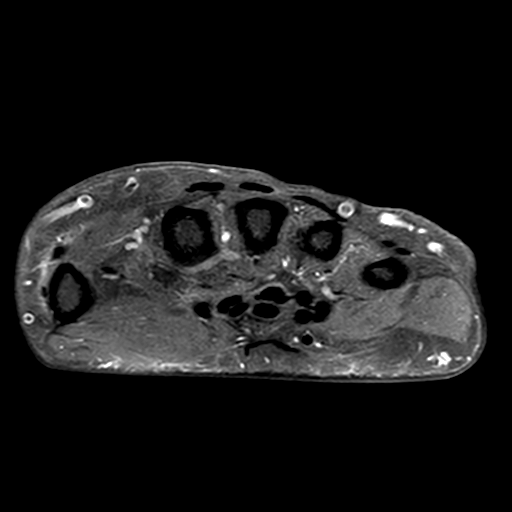
[im 7/25]
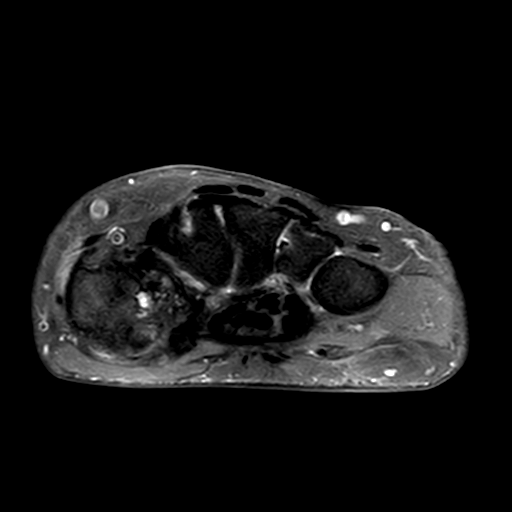
[im 10/25]
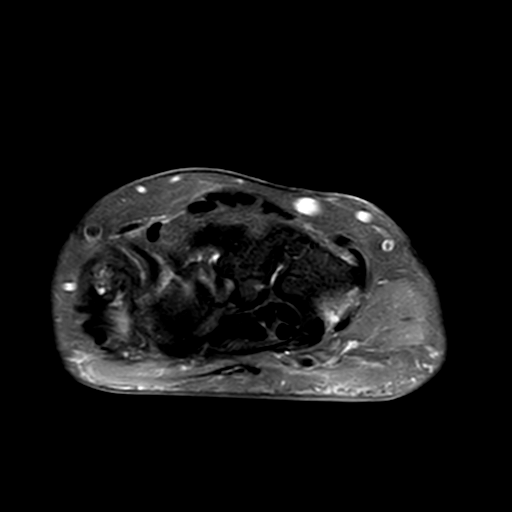
[im 13/25]
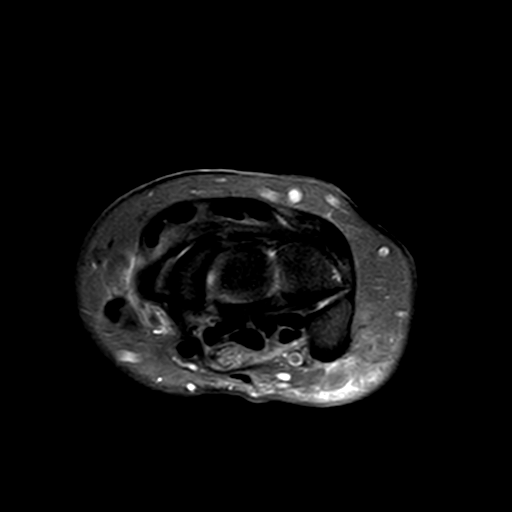
[im 16/25]
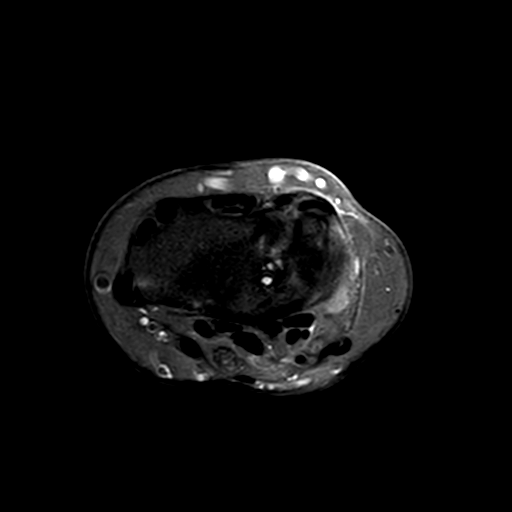
[im 19/25]
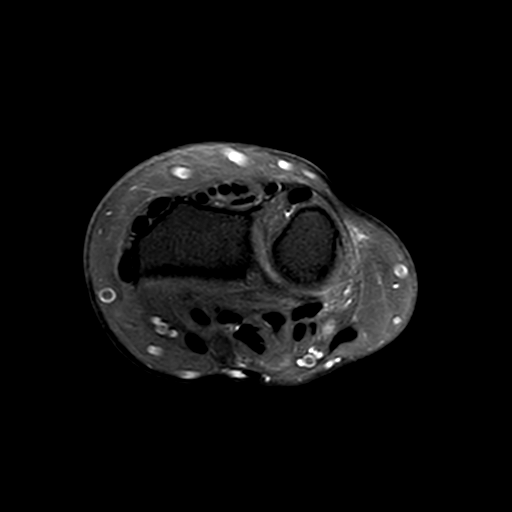
[im 22/25]
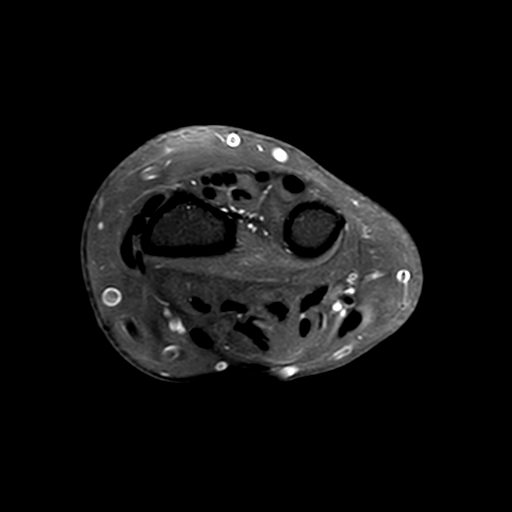

[Series 801: PD fat-sat · coronal · right · 2.5mm · 0.23mm/px · 3 of 18 slices shown (2 of 3)]
[im 4/18]
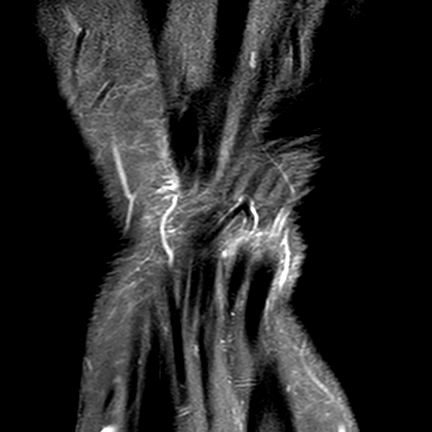
[im 11/18]
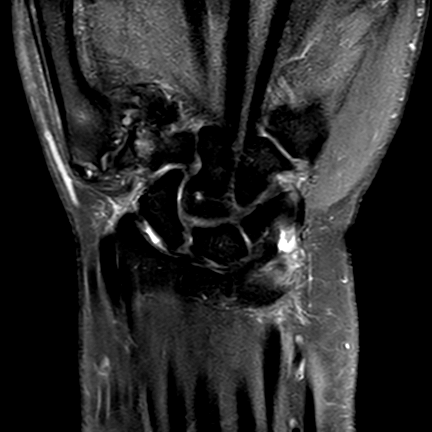
[im 18/18]
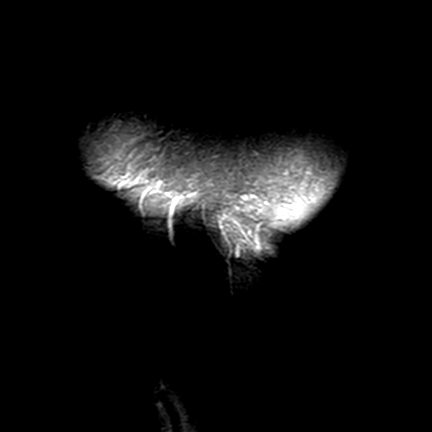

[Series 901: PD fat-sat · sagittal · right · 3.0mm · 0.23mm/px · 3 of 21 slices shown (3 of 3)]
[im 4/21]
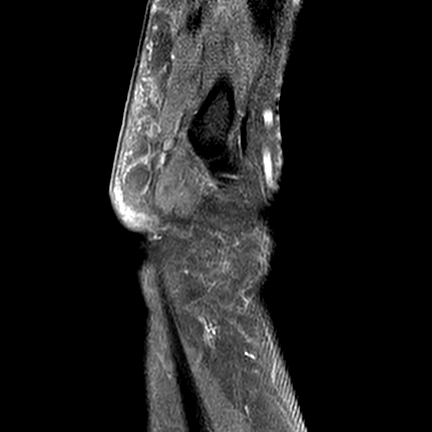
[im 11/21]
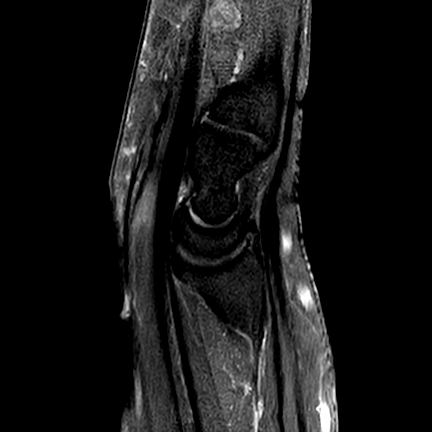
[im 17/21]
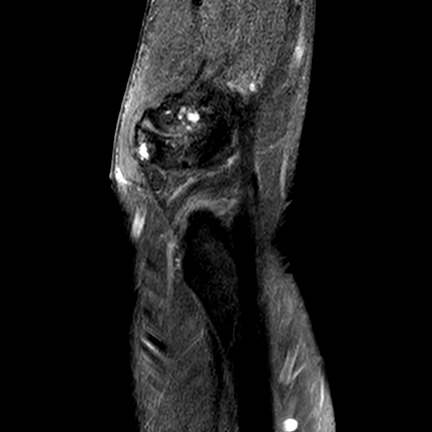

[17 of 40 positions shown; findings below may reference images not displayed]

FINDINGS: BONES AND JOINTS: No periostitis, erosions, marrow edema, fracture or stress 
response. Marked thumb carpometacarpal (696-8) joint degenerative change and 
mild degenerative change of the scaphotrapeziotrapezoid (STT) complex, thumb 
metacarpophalangeal (MCP) and several interphalangeal (IP) joints. Subcortical 
cystic change and flattening of the proximal medial lunate, which abuts the 
distal ulna, consistent with ulnolunate abutment. Subcortical cystic change of 
the metacarpal heads, most marked involving the thumb and second. Small 
additional carpal cysts. Normal alignment. The distal ulna is well located 
within the radial sigmoid notch. No joint effusion. No discrete synovitis. 
LIGAMENTS: The scapholunate ligament is preserved. The lunotriquetral ligament 
is not well delineated, suspicious for tear. Full-thickness tear of the radial 
attachments/central body of the TFCC disc. The volar and dorsal components of 
the radioulnar ligament of the TFCC are intact. The ulnocarpal components of the 
TFCC are intact. The extrinsic ligaments are grossly intact.  
TENDONS: The flexor and extensor tendons are preserved. No tendinosis, tear or 
tenosynovitis. The flexor retinaculum is not bowed. 
SOFT TISSUES: Musculature is symmetric without mass, signal abnormality or 
atrophy. Neurovascular bundles are negative. Specifically, the ulnar nerve is 
negative at the level of Brouwer canal. No focal fluid collection or suggestion of 
ganglion. No focal soft tissue swelling.
IMPRESSION: 1.  No specific findings for arthropathic psoriasis. 
2.  Multifocal degenerative change, most marked involving the 696-8 joint. 
3.  Ulnolunate abutment, full-thickness tear of the TFCC disc and probable 
lunotriquetral ligament tear.

## 2022-09-09 IMAGING — MR MRI RIGHT ANKLE WITHOUT CONTRAST
3 of 5 series · 8 of 40 positions shown · IV contrast (gadolinium)
Comparison: 03/03/2020 radiographs

________________________________________________________________________________________________ 
MRI RIGHT ANKLE WITHOUT CONTRAST, 09/09/2022 [DATE]: 
CLINICAL INDICATION: Arthropathic psoriasis, unspecified.
TECHNIQUE: Multiplanar, multiecho position MR images of the ankle were performed 
without intravenous gadolinium enhancement. Patient was scanned on a
magnet.

[Series 201: survey_right · axial · 10.0mm · 0.65mm/px · z∈[-20,+125]mm · 3 of 9 slices shown]
[im 1/9]
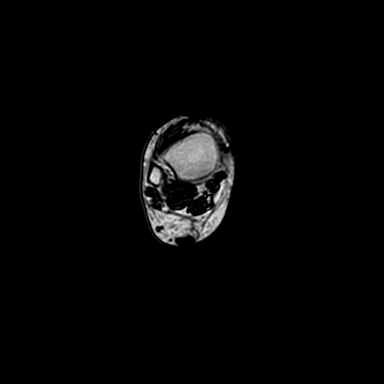
[im 5/9]
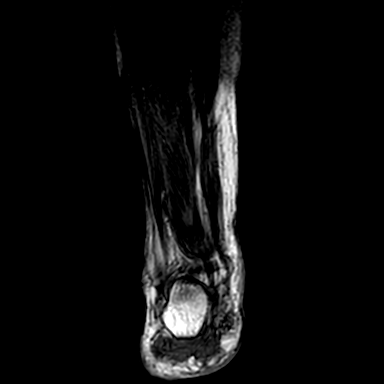
[im 9/9]
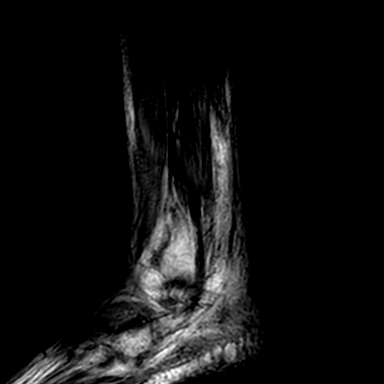

[Series 301: (person_name)_(person_name)_(person_name) · axial · 3.0mm · 0.18mm/px · z∈[-108,-3]mm · 3 of 42 slices shown]
[im 8/42]
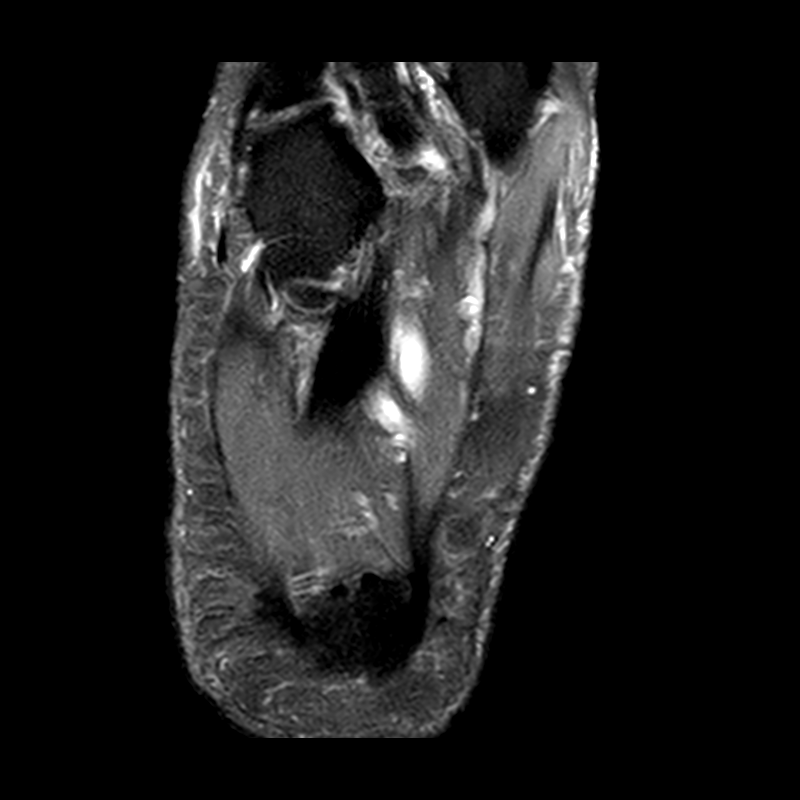
[im 23/42]
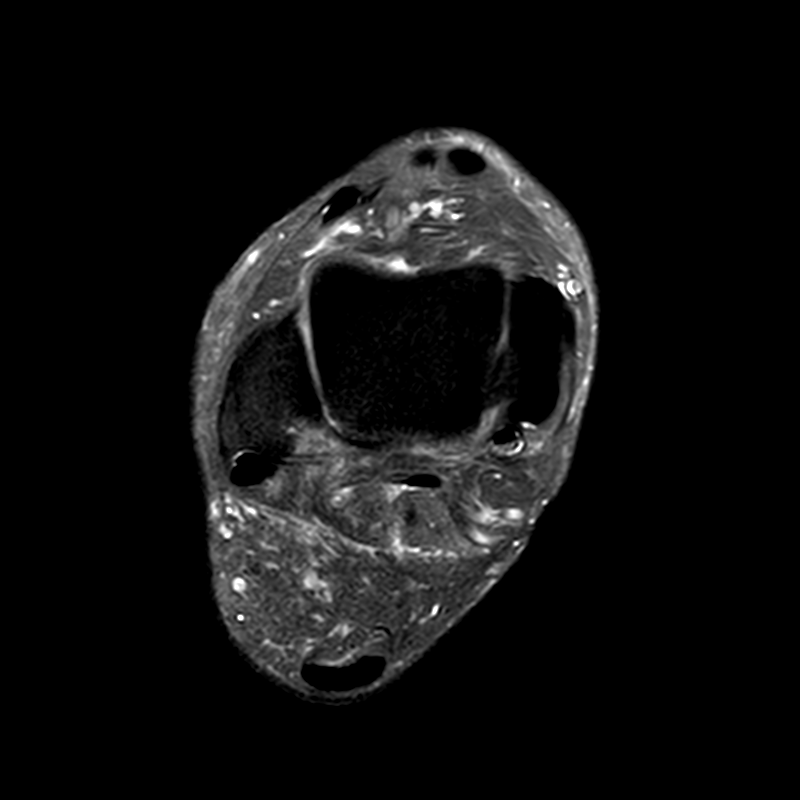
[im 38/42]
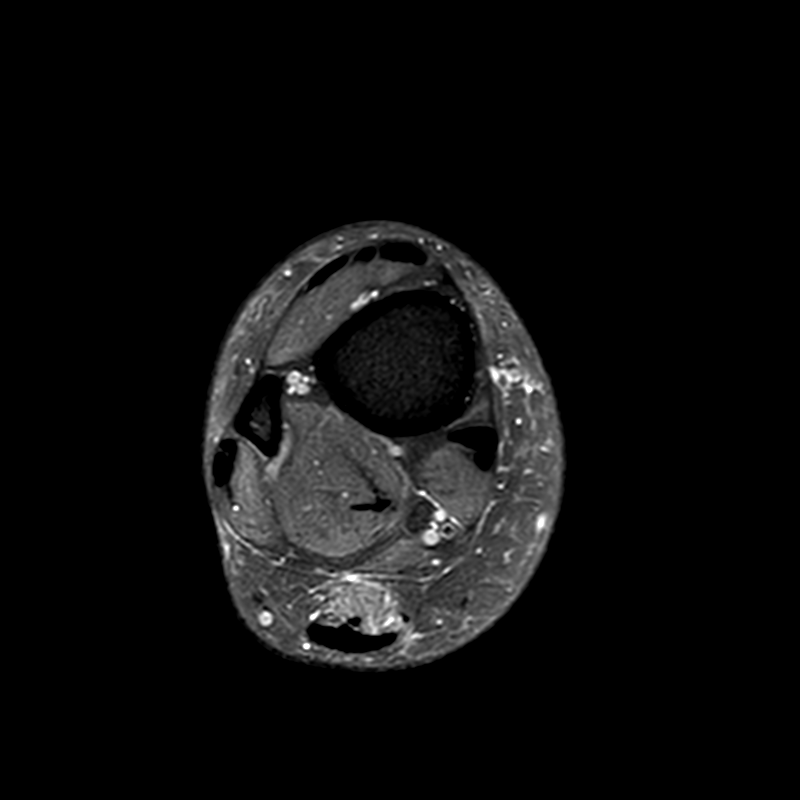

[Series 401: t1_sag · sagittal · 2.5mm · 0.16mm/px · 2 of 25 slices shown]
[im 5/25]
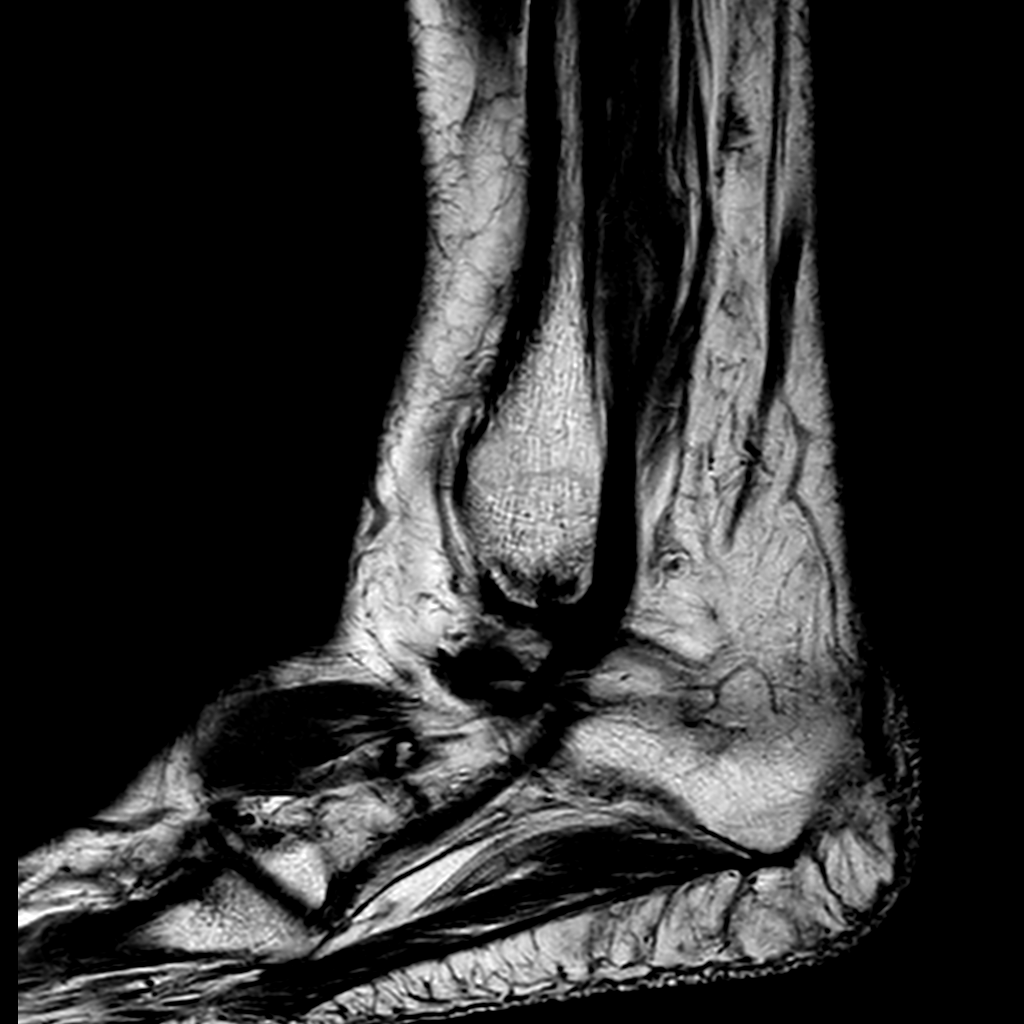
[im 13/25]
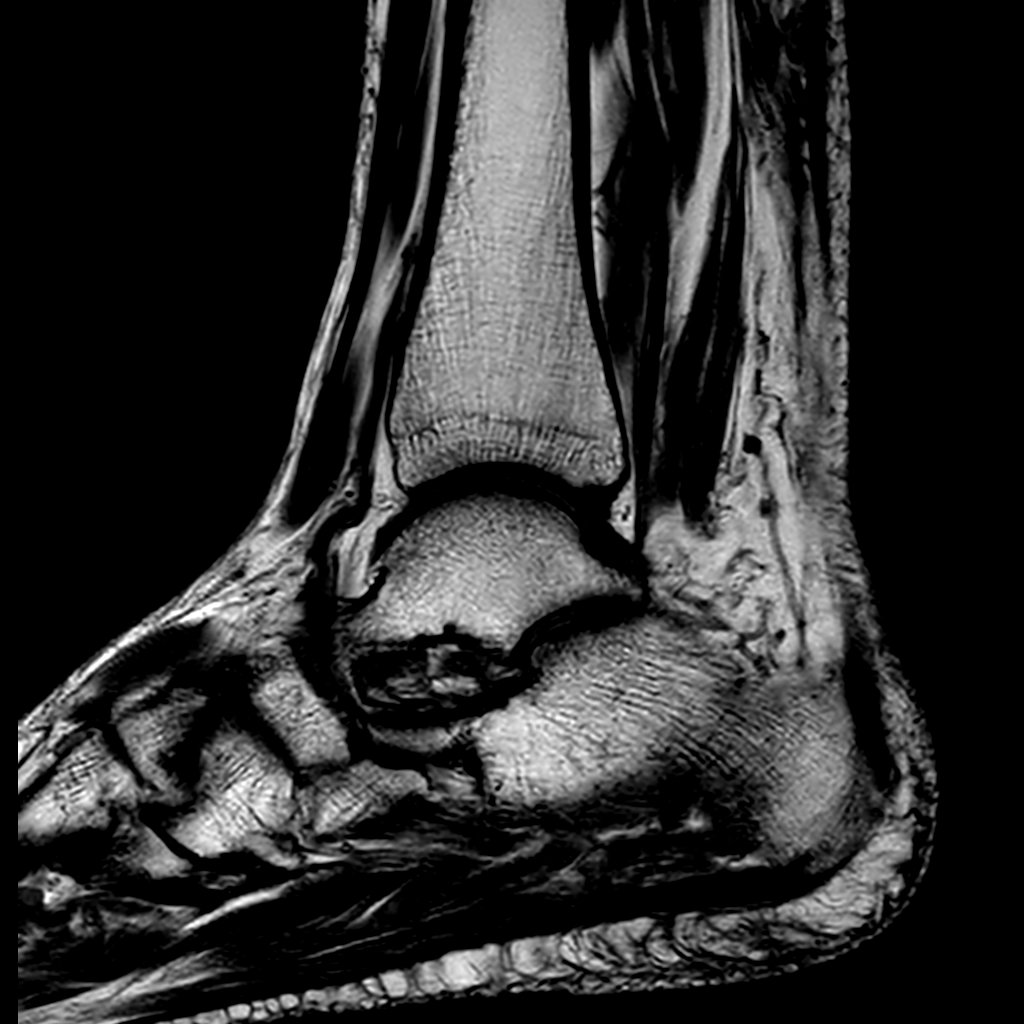

[8 of 40 positions shown; findings below may reference images not displayed]

FINDINGS: TENDONS: Small amount of fluid in the posterior tibial tendon (PTT) sheath, 
consistent with tenosynovitis. The remaining tendons are preserved. 
LIGAMENTS:  
LATERAL LIGAMENTS: The anterior talofibular ligament is intact. The 
calcaneofibular ligament and posterior talofibular ligament are preserved. 
SYNDESMOTIC LIGAMENTS: The anterior and posterior tibiofibular and interosseous 
ligaments are preserved. 
DELTOID LIGAMENTOUS COMPLEX: The deep and superficial components of the deltoid 
ligamentous complex are intact. 
SINUS TARSI LIGAMENTS: The cervical and interosseous ligaments are preserved. 
The inferior extensor retinaculum appears intact.  
OH LIGAMENTOUS COMPLEX: Plantar calcaneonavicular ligament preserved. 
BONES AND JOINTS: No erosions or periostitis. No fracture, contusion or stress 
response. The talar dome is preserved. No focal osteochondral lesion. 
Degenerative change of the lateral aspect of the subtalar joint with 
full-thickness chondromalacia and subcortical cystic change of the lateral 
process of the talus. Small amount of loculated fluid in the sinus tarsi and 
extending along the extensor retinaculum. Mild degenerative change of the 
midfoot. 0.7 cm multiloculated ganglion dorsal to the navicular-medial cuneiform 
articulation. Calcaneal enthesophytes.  
ADDITIONAL FINDINGS: Musculature is symmetric without mass, signal abnormality 
or atrophy. Sinus tarsi fat is preserved. Plantar fascia is intact. Tarsal 
tunnel is preserved. Mild soft tissue swelling in Jhan fat pad. Subcutaneous 
tissues are negative.
IMPRESSION: 1.  No specific findings for psoriatic arthropathy.  
2.  Degenerative change of the lateral aspect of the subtalar joint and midfoot, 
with 0.7 cm multiloculated ganglion. 
3.  Calcaneal enthesophytes.  
4.  Mild PTT tenosynovitis. 
5.  Mild soft tissue swelling in Jhan fat pad.
